# Patient Record
Sex: Female | Born: 1995 | Race: Black or African American | Hispanic: No | Marital: Single | Smoking: Current some day smoker
Health system: Southern US, Community
[De-identification: ages and names within clinical notes are randomized; demographics above are authoritative.]

## PROBLEM LIST (undated history)

## (undated) DIAGNOSIS — K5900 Constipation, unspecified: Secondary | ICD-10-CM

## (undated) DIAGNOSIS — G43909 Migraine, unspecified, not intractable, without status migrainosus: Secondary | ICD-10-CM

## (undated) DIAGNOSIS — A609 Anogenital herpesviral infection, unspecified: Secondary | ICD-10-CM

---
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
## 2015-01-10 ENCOUNTER — Emergency Department (HOSPITAL_BASED_OUTPATIENT_CLINIC_OR_DEPARTMENT_OTHER)
Admission: EM | Admit: 2015-01-10 | Discharge: 2015-01-10 | Disposition: A | Discharge status: Home / Self Care | Payer: Medicaid Other | Attending: Emergency Medicine | Admitting: Emergency Medicine

## 2015-01-10 ENCOUNTER — Encounter (HOSPITAL_BASED_OUTPATIENT_CLINIC_OR_DEPARTMENT_OTHER): Payer: Self-pay | Admitting: Emergency Medicine

## 2015-01-10 DIAGNOSIS — Z79899 Other long term (current) drug therapy: Secondary | ICD-10-CM | POA: Insufficient documentation

## 2015-01-10 DIAGNOSIS — A599 Trichomoniasis, unspecified: Secondary | ICD-10-CM | POA: Diagnosis not present

## 2015-01-10 DIAGNOSIS — N766 Ulceration of vulva: Secondary | ICD-10-CM | POA: Diagnosis not present

## 2015-01-10 DIAGNOSIS — Z3202 Encounter for pregnancy test, result negative: Secondary | ICD-10-CM | POA: Diagnosis not present

## 2015-01-10 DIAGNOSIS — R102 Pelvic and perineal pain: Secondary | ICD-10-CM | POA: Diagnosis present

## 2015-01-10 LAB — URINALYSIS, ROUTINE W REFLEX MICROSCOPIC
Bilirubin Urine: NEGATIVE
Glucose, UA: NEGATIVE mg/dL
HGB URINE DIPSTICK: NEGATIVE
KETONES UR: 15 mg/dL — AB
Leukocytes, UA: NEGATIVE
Nitrite: NEGATIVE
PH: 6.5 (ref 5.0–8.0)
Protein, ur: NEGATIVE mg/dL
Specific Gravity, Urine: 1.03 (ref 1.005–1.030)
UROBILINOGEN UA: 1 mg/dL (ref 0.0–1.0)

## 2015-01-10 LAB — WET PREP, GENITAL: Yeast Wet Prep HPF POC: NONE SEEN

## 2015-01-10 LAB — PREGNANCY, URINE: Preg Test, Ur: NEGATIVE

## 2015-01-10 LAB — GC/CHLAMYDIA PROBE AMP (~~LOC~~) NOT AT ARMC
Chlamydia: NEGATIVE
Neisseria Gonorrhea: NEGATIVE

## 2015-01-10 MED ORDER — AZITHROMYCIN 250 MG PO TABS
1000.0000 mg | ORAL_TABLET | Freq: Once | ORAL | Status: AC
  Administered 2015-01-10: 1000 mg via ORAL
  Filled 2015-01-10: qty 4

## 2015-01-10 MED ORDER — VALACYCLOVIR HCL 1 G PO TABS: 1000.0000 mg | ORAL_TABLET | Freq: Two times a day (BID) | ORAL | Status: AC

## 2015-01-10 MED ORDER — CEFTRIAXONE SODIUM 250 MG IJ SOLR
250.0000 mg | Freq: Once | INTRAMUSCULAR | Status: AC
  Administered 2015-01-10: 250 mg via INTRAMUSCULAR
  Filled 2015-01-10: qty 250

## 2015-01-10 MED ORDER — METRONIDAZOLE 500 MG PO TABS: 500.0000 mg | ORAL_TABLET | Freq: Two times a day (BID) | ORAL | Status: DC

## 2015-01-10 MED ORDER — ONDANSETRON 4 MG PO TBDP
4.0000 mg | ORAL_TABLET | Freq: Once | ORAL | Status: AC
  Administered 2015-01-10: 4 mg via ORAL
  Filled 2015-01-10: qty 1

## 2015-01-10 NOTE — Discharge Instructions (Signed)
Trichomoniasis Trichomoniasis is an infection caused by an organism called Trichomonas. The infection can affect both women and men. In women, the outer female genitalia and the vagina are affected. In men, the penis is mainly affected, but the prostate and other reproductive organs can also be involved. Trichomoniasis is a sexually transmitted infection (STI) and is most often passed to another person through sexual contact.  RISK FACTORS  Having unprotected sexual intercourse.  Having sexual intercourse with an infected partner. SIGNS AND SYMPTOMS  Symptoms of trichomoniasis in women include:  Abnormal gray-green frothy vaginal discharge.  Itching and irritation of the vagina.  Itching and irritation of the area outside the vagina. Symptoms of trichomoniasis in men include:   Penile discharge with or without pain.  Pain during urination. This results from inflammation of the urethra. DIAGNOSIS  Trichomoniasis may be found during a Pap test or physical exam. Your health care provider may use one of the following methods to help diagnose this infection:  Examining vaginal discharge under a microscope. For men, urethral discharge would be examined.  Testing the pH of the vagina with a test tape.  Using a vaginal swab test that checks for the Trichomonas organism. A test is available that provides results within a few minutes.  Doing a culture test for the organism. This is not usually needed. TREATMENT   You may be given medicine to fight the infection. Women should inform their health care provider if they could be or are pregnant. Some medicines used to treat the infection should not be taken during pregnancy.  Your health care provider may recommend over-the-counter medicines or creams to decrease itching or irritation.  Your sexual partner will need to be treated if infected. HOME CARE INSTRUCTIONS   Take medicines only as directed by your health care provider.  Take  over-the-counter medicine for itching or irritation as directed by your health care provider.  Do not have sexual intercourse while you have the infection.  Women should not douche or wear tampons while they have the infection.  Discuss your infection with your partner. Your partner may have gotten the infection from you, or you may have gotten it from your partner.  Have your sex partner get examined and treated if necessary.  Practice safe, informed, and protected sex.  See your health care provider for other STI testing. SEEK MEDICAL CARE IF:   You still have symptoms after you finish your medicine.  You develop abdominal pain.  You have pain when you urinate.  You have bleeding after sexual intercourse.  You develop a rash.  Your medicine makes you sick or makes you throw up (vomit). MAKE SURE YOU:  Understand these instructions.  Will watch your condition.  Will get help right away if you are not doing well or get worse. Document Released: 05/21/2001 Document Revised: 04/11/2014 Document Reviewed: 09/06/2013 [facility] Patient Information 2015 [place], [place]. This information is not intended to replace advice given to you by your health care provider. Make sure you discuss any questions you have with your health care provider. Herpes Simplex Herpes simplex is generally classified as Type 1 or Type 2. Type 1 is generally the type that is responsible for cold sores. Type 2 is generally associated with sexually transmitted diseases. We now know that most of the thoughts on these viruses are inaccurate. We find that HSV1 is also present genitally and HSV2 can be present orally, but this will vary in different locations of the world. Herpes simplex is  usually detected by doing a culture. Blood tests are also available for this virus; however, the accuracy is often not as good.  PREPARATION FOR TEST No preparation or fasting is necessary. NORMAL FINDINGS  No virus present  No  HSV antigens or antibodies present Ranges for normal findings may vary among different laboratories and hospitals. You should always check with your doctor after having lab work or other tests done to discuss the meaning of your test results and whether your values are considered within normal limits. MEANING OF TEST  Your caregiver will go over the test results with you and discuss the importance and meaning of your results, as well as treatment options and the need for additional tests if necessary. OBTAINING THE TEST RESULTS  It is your responsibility to obtain your test results. Ask the lab or department performing the test when and how you will get your results. Document Released: 12/28/2004 Document Revised: 02/17/2012 Document Reviewed: 11/05/2008 [facility] Patient Information 2015 [place], [place]. This information is not intended to replace advice given to you by your health care provider. Make sure you discuss any questions you have with your health care provider.

## 2015-01-10 NOTE — ED Provider Notes (Signed)
CSN: [id number]     Arrival date & time 01/10/15  0415 History   First MD Initiated Contact with Patient 01/10/15 [phone]     Chief Complaint  Patient presents with  . Vaginal Pain     (Consider location/radiation/quality/duration/timing/severity/associated sxs/prior Treatment) Patient is a 19 y.o. female presenting with vaginal pain.  Vaginal Pain This is a new problem. Episode onset: 3 days ago. The problem occurs constantly. The problem has not changed since onset.Pertinent negatives include no chest pain, no abdominal pain, no headaches and no shortness of breath. Exacerbated by: palpation. She has tried nothing for the symptoms.    History reviewed. No pertinent past medical history. History reviewed. No pertinent past surgical history. History reviewed. No pertinent family history. History  Substance Use Topics  . Smoking status: Never Smoker   . Smokeless tobacco: Never Used  . Alcohol Use: No   OB History    No data available     Review of Systems  Respiratory: Negative for shortness of breath.   Cardiovascular: Negative for chest pain.  Gastrointestinal: Negative for abdominal pain.  Genitourinary: Positive for vaginal pain.  Neurological: Negative for headaches.  All other systems reviewed and are negative.     Allergies  Review of patient's allergies indicates no known allergies.  Home Medications   Prior to Admission medications   Medication Sig Start Date End Date Taking? Authorizing Provider  HYDROcodone-acetaminophen (NORCO/VICODIN) 5-325 MG per tablet Take 1-2 tablets by mouth every 6 (six) hours as needed for moderate pain. 10/04/14   Suzi RootsKevin E Steinl, MD  metroNIDAZOLE (FLAGYL) 500 MG tablet Take 1 tablet (500 mg total) by mouth 2 (two) times daily. One po bid x 7 days 01/10/15   Mirian MoMatthew Gentry, MD  ondansetron (ZOFRAN) 4 MG tablet Take 1 tablet (4 mg total) by mouth every 6 (six) hours as needed for nausea or vomiting. 11/06/14   Dione Boozeavid Glick, MD   valACYclovir (VALTREX) 1000 MG tablet Take 1 tablet (1,000 mg total) by mouth 2 (two) times daily. 01/10/15 01/17/15  Mirian MoMatthew Gentry, MD   BP 132/58 mmHg  Pulse 91  Temp(Src) 98.5 F (36.9 C) (Oral)  Resp 16  Wt 120 lb (54.432 kg)  SpO2 100%  LMP 12/17/2014 Physical Exam  Constitutional: She is oriented to person, place, and time. She appears well-developed and well-nourished.  HENT:  Head: Normocephalic and atraumatic.  Right Ear: External ear normal.  Left Ear: External ear normal.  Eyes: Conjunctivae and EOM are normal. Pupils are equal, round, and reactive to light.  Neck: Normal range of motion. Neck supple.  Cardiovascular: Normal rate, regular rhythm, normal heart sounds and intact distal pulses.   Pulmonary/Chest: Effort normal and breath sounds normal.  Abdominal: Soft. Bowel sounds are normal. There is no tenderness.  Genitourinary: Cervix exhibits no motion tenderness and no friability. Right adnexum displays no tenderness. Left adnexum displays no tenderness.  2 ulcerations to exterior surface of labia minora, scant white dc  Musculoskeletal: Normal range of motion.  Neurological: She is alert and oriented to person, place, and time.  Skin: Skin is warm and dry.  Vitals reviewed.   ED Course  Procedures (including critical care time) Labs Review Labs Reviewed  WET PREP, GENITAL - Abnormal; Notable for the following:    Trich, Wet Prep FEW (*)    Clue Cells Wet Prep HPF POC FEW (*)    WBC, Wet Prep HPF POC FEW (*)    All other components within normal limits  URINALYSIS,  ROUTINE W REFLEX MICROSCOPIC - Abnormal; Notable for the following:    Ketones, ur 15 (*)    All other components within normal limits  HERPES SIMPLEX VIRUS CULTURE  PREGNANCY, URINE  RPR  HIV ANTIBODY (ROUTINE TESTING)  GC/CHLAMYDIA PROBE AMP ([place])    Imaging Review No results found.   EKG Interpretation None      MDM   Final diagnoses:  Genital ulcer, female   Trichomoniasis    19 y.o. female without pertinent PMH presents with vaginal ulcerations and pain x 3 days.  One female sexual partner in the last 6 months, unprotected intercourse. No history of sexually transmitted infection. Physical exam as above.  No signs of PID.  Dorna Bloom with trich.  Pt offered and accepted treatment for gc/chlamydia.  HSV swab obtained.  Discussed empiric use of valacyclovir which the patient elected to receive. Discharged home to follow-up with PCP.    I have reviewed all laboratory and imaging studies if ordered as above  1. Genital ulcer, female   2. Trichomoniasis         Mirian Mo, MD 01/10/15 [phone]

## 2015-01-10 NOTE — ED Notes (Signed)
Patient states that she has had pain in her perineal area. X 3 days. Patient states that she has blisters on her vagina

## 2015-01-11 LAB — HERPES SIMPLEX VIRUS CULTURE

## 2015-01-11 LAB — HIV ANTIBODY (ROUTINE TESTING W REFLEX): HIV Screen 4th Generation wRfx: NONREACTIVE

## 2015-01-11 LAB — RPR: RPR: NONREACTIVE

## 2015-01-13 ENCOUNTER — Telehealth (HOSPITAL_BASED_OUTPATIENT_CLINIC_OR_DEPARTMENT_OTHER): Payer: Self-pay | Admitting: *Deleted

## 2015-01-16 ENCOUNTER — Telehealth (HOSPITAL_COMMUNITY): Payer: Self-pay

## 2015-02-07 ENCOUNTER — Emergency Department (HOSPITAL_BASED_OUTPATIENT_CLINIC_OR_DEPARTMENT_OTHER)
Admission: EM | Admit: 2015-02-07 | Discharge: 2015-02-08 | Disposition: A | Discharge status: Home / Self Care | Payer: Medicaid Other | Attending: Emergency Medicine | Admitting: Emergency Medicine

## 2015-02-07 ENCOUNTER — Encounter (HOSPITAL_BASED_OUTPATIENT_CLINIC_OR_DEPARTMENT_OTHER): Payer: Self-pay | Admitting: Emergency Medicine

## 2015-02-07 DIAGNOSIS — R42 Dizziness and giddiness: Secondary | ICD-10-CM | POA: Insufficient documentation

## 2015-02-07 DIAGNOSIS — R109 Unspecified abdominal pain: Secondary | ICD-10-CM | POA: Insufficient documentation

## 2015-02-07 DIAGNOSIS — R112 Nausea with vomiting, unspecified: Secondary | ICD-10-CM | POA: Insufficient documentation

## 2015-02-07 DIAGNOSIS — Z792 Long term (current) use of antibiotics: Secondary | ICD-10-CM | POA: Insufficient documentation

## 2015-02-07 DIAGNOSIS — Z3201 Encounter for pregnancy test, result positive: Secondary | ICD-10-CM | POA: Insufficient documentation

## 2015-02-07 DIAGNOSIS — Z3490 Encounter for supervision of normal pregnancy, unspecified, unspecified trimester: Secondary | ICD-10-CM

## 2015-02-07 DIAGNOSIS — Z3200 Encounter for pregnancy test, result unknown: Secondary | ICD-10-CM | POA: Diagnosis present

## 2015-02-07 LAB — PREGNANCY, URINE: Preg Test, Ur: POSITIVE — AB

## 2015-02-07 MED ORDER — PRENATAL COMPLETE 14-0.4 MG PO TABS: 1.0000 | ORAL_TABLET | Freq: Every day | ORAL | Status: DC

## 2015-02-07 NOTE — ED Notes (Signed)
Pt c/o dizziness when standing, nausea in am, vomited 1or 2 times past couple weeks,  abd pain at times chills/ fever 98 to 100,  Denies vag dc lmp 1/9

## 2015-02-07 NOTE — ED Notes (Signed)
Missed period that was due 2/9.  Nausea x2-3 weeks.  1 home preg said positive and one said neg.  Pt wants to know for sure and what to do next.

## 2015-02-07 NOTE — ED Provider Notes (Signed)
CSN: [id number]     Arrival date & time 02/07/15  2328 History  This chart was scribed for Hanley SeamenJohn L Fredna Stricker, MD by Evon Slackerrance Branch, ED Scribe. This patient was seen in room MH05/MH05 and the patient's care was started at 11:40 PM.     Chief Complaint  Patient presents with  . Possible Pregnancy   Patient is a 19 y.o. female presenting with pregnancy problem. The history is provided by the patient. No language interpreter was used.  Possible Pregnancy   HPI Comments: Leah Knapp is a 19 y.o. female who presents to the Emergency Department for possible pregnancy. Pt states that she thinks that she pregnant. Pt states that she feels dizzy described as "everything spinning" when standing. Pt states that when waking up she feels nauseated. Pt states she has intermittent vomiting and intermittent abdominal pain; she denies pain presently. She also states that she has noticed an increased appetite. Pt state she has been having a fever (max temp 100) and chills as well for the past week. LMP 12/17/2014. Pt states she has taken 2 at-home pregnancy tests; one was positive and the other was negative. Denies vaginal bleeding or vaginal discharge.   History reviewed. No pertinent past medical history. History reviewed. No pertinent past surgical history. No family history on file. History  Substance Use Topics  . Smoking status: Never Smoker   . Smokeless tobacco: Never Used  . Alcohol Use: No   OB History    No data available     Review of Systems  All other systems reviewed and are negative.   Allergies  Review of patient's allergies indicates no known allergies.  Home Medications   Prior to Admission medications   Medication Sig Start Date End Date Taking? Authorizing Provider  HYDROcodone-acetaminophen (NORCO/VICODIN) 5-325 MG per tablet Take 1-2 tablets by mouth every 6 (six) hours as needed for moderate pain. 10/04/14   Suzi RootsKevin E Steinl, MD  metroNIDAZOLE (FLAGYL) 500 MG tablet Take 1  tablet (500 mg total) by mouth 2 (two) times daily. One po bid x 7 days 01/10/15   Mirian MoMatthew Gentry, MD  ondansetron (ZOFRAN) 4 MG tablet Take 1 tablet (4 mg total) by mouth every 6 (six) hours as needed for nausea or vomiting. 11/06/14   Dione Boozeavid Glick, MD   BP 128/73 mmHg  Pulse 120  Temp(Src) 100.4 F (38 C) (Oral)  Resp 18  Ht 5\' 6"  (1.676 m)  Wt 124 lb (56.246 kg)  BMI 20.02 kg/m2  SpO2 100%  LMP 12/17/2014   Physical Exam  Nursing note and vitals reviewed. General: Well-developed, well-nourished female in no acute distress; appearance consistent with age of record HENT: normocephalic; atraumatic Eyes: pupils equal, round and reactive to light; extraocular muscles intact Neck: supple Heart: regular rate and rhythm; no murmurs, rubs or gallops Lungs: clear to auscultation bilaterally Abdomen: soft; nondistended; nontender; no masses or hepatosplenomegaly; bowel sounds present GU: No CVA tenderness.  Extremities: No deformity; full range of motion Neurologic: Awake, alert and oriented; motor function intact in all extremities and symmetric; no facial droop Skin: Warm and dry Psychiatric: Normal mood and affect  ED Course  Procedures (including critical care time) DIAGNOSTIC STUDIES: Oxygen Saturation is 100% on RA, normal by my interpretation.    COORDINATION OF CARE: 11:47 PM-Discussed treatment plan with pt at bedside and pt agreed to plan.     MDM   Nursing notes and vitals signs, including pulse oximetry, reviewed.  Summary of this visit's results, reviewed by  myself:  Labs:  Results for orders placed or performed during the hospital encounter of 02/07/15 (from the past 24 hour(s))  Pregnancy, urine     Status: Abnormal   Collection Time: 02/07/15 11:37 PM  Result Value Ref Range   Preg Test, Ur POSITIVE (A) NEGATIVE   Patient advised of test results. She requests information about termination; Sue Lush, RN will discuss options with her.  I personally performed the  services described in this documentation, which was scribed in my presence. The recorded information has been reviewed and is accurate.     Hanley Seamen, MD 02/07/15 [phone]

## 2015-05-10 ENCOUNTER — Emergency Department (HOSPITAL_BASED_OUTPATIENT_CLINIC_OR_DEPARTMENT_OTHER)
Admission: EM | Admit: 2015-05-10 | Discharge: 2015-05-10 | Disposition: A | Discharge status: Home / Self Care | Payer: Medicaid Other | Attending: Emergency Medicine | Admitting: Emergency Medicine

## 2015-05-10 ENCOUNTER — Emergency Department (HOSPITAL_BASED_OUTPATIENT_CLINIC_OR_DEPARTMENT_OTHER): Payer: Medicaid Other

## 2015-05-10 ENCOUNTER — Encounter (HOSPITAL_BASED_OUTPATIENT_CLINIC_OR_DEPARTMENT_OTHER): Payer: Self-pay | Admitting: Emergency Medicine

## 2015-05-10 DIAGNOSIS — R079 Chest pain, unspecified: Secondary | ICD-10-CM | POA: Diagnosis present

## 2015-05-10 DIAGNOSIS — Z792 Long term (current) use of antibiotics: Secondary | ICD-10-CM | POA: Insufficient documentation

## 2015-05-10 DIAGNOSIS — R0789 Other chest pain: Secondary | ICD-10-CM | POA: Diagnosis not present

## 2015-05-10 DIAGNOSIS — Z79899 Other long term (current) drug therapy: Secondary | ICD-10-CM | POA: Diagnosis not present

## 2015-05-10 DIAGNOSIS — Z3202 Encounter for pregnancy test, result negative: Secondary | ICD-10-CM | POA: Diagnosis not present

## 2015-05-10 LAB — CBC WITH DIFFERENTIAL/PLATELET
BASOS ABS: 0 10*3/uL (ref 0.0–0.1)
Basophils Relative: 0 % (ref 0–1)
Eosinophils Absolute: 0.1 10*3/uL (ref 0.0–0.7)
Eosinophils Relative: 1 % (ref 0–5)
HEMATOCRIT: 37.3 % (ref 36.0–46.0)
HEMOGLOBIN: 12.2 g/dL (ref 12.0–15.0)
Lymphocytes Relative: 26 % (ref 12–46)
Lymphs Abs: 1.8 10*3/uL (ref 0.7–4.0)
MCH: 32.2 pg (ref 26.0–34.0)
MCHC: 32.7 g/dL (ref 30.0–36.0)
MCV: 98.4 fL (ref 78.0–100.0)
MONO ABS: 0.5 10*3/uL (ref 0.1–1.0)
Monocytes Relative: 7 % (ref 3–12)
NEUTROS ABS: 4.6 10*3/uL (ref 1.7–7.7)
Neutrophils Relative %: 66 % (ref 43–77)
Platelets: 208 10*3/uL (ref 150–400)
RBC: 3.79 MIL/uL — ABNORMAL LOW (ref 3.87–5.11)
RDW: 11.7 % (ref 11.5–15.5)
WBC: 7 10*3/uL (ref 4.0–10.5)

## 2015-05-10 LAB — BASIC METABOLIC PANEL
Anion gap: 6 (ref 5–15)
BUN: 10 mg/dL (ref 6–20)
CALCIUM: 9.1 mg/dL (ref 8.9–10.3)
CHLORIDE: 105 mmol/L (ref 101–111)
CO2: 28 mmol/L (ref 22–32)
CREATININE: 0.8 mg/dL (ref 0.44–1.00)
GFR calc Af Amer: >60 mL/min (ref >60)
Glucose, Bld: 97 mg/dL (ref 65–99)
Potassium: 3.3 mmol/L — ABNORMAL LOW (ref 3.5–5.1)
SODIUM: 139 mmol/L (ref 135–145)

## 2015-05-10 LAB — TROPONIN I: Troponin I: <0.03 ng/mL (ref <0.031)

## 2015-05-10 LAB — PREGNANCY, URINE: Preg Test, Ur: NEGATIVE

## 2015-05-10 LAB — D-DIMER, QUANTITATIVE: D-Dimer, Quant: <0.27 ug/mL-FEU (ref 0.00–0.48)

## 2015-05-10 MED ORDER — HYDROCODONE-ACETAMINOPHEN 5-325 MG PO TABS: 1.0000 | ORAL_TABLET | Freq: Four times a day (QID) | ORAL | Status: DC | PRN

## 2015-05-10 NOTE — ED Notes (Signed)
Patient transported to X-ray 

## 2015-05-10 NOTE — Discharge Instructions (Signed)

## 2015-05-10 NOTE — ED Provider Notes (Signed)
CSN: [id number]     Arrival date & time 05/10/15  1601 History   First MD Initiated Contact with Patient 05/10/15 1635     Chief Complaint  Patient presents with  . Chest Pain     HPI Patient presents with chest pain which started around noon today.  Patient states hurts when she takes a deep breath in.  Denies any nausea vomiting or diaphoresis.  No history of coronary disease.  No history of hypertension.  Her heart score = 1 History reviewed. No pertinent past medical history. History reviewed. No pertinent past surgical history. History reviewed. No pertinent family history. History  Substance Use Topics  . Smoking status: Never Smoker   . Smokeless tobacco: Never Used  . Alcohol Use: No   OB History    No data available     Review of Systems All other systems reviewed and are negative   Allergies  Review of patient's allergies indicates no known allergies.  Home Medications   Prior to Admission medications   Medication Sig Start Date End Date Taking? Authorizing Provider  HYDROcodone-acetaminophen (NORCO/VICODIN) 5-325 MG per tablet Take 1-2 tablets by mouth every 6 (six) hours as needed for moderate pain. 05/10/15   Nelva Nayobert Carlen Fils, MD  metroNIDAZOLE (FLAGYL) 500 MG tablet Take 1 tablet (500 mg total) by mouth 2 (two) times daily. One po bid x 7 days 01/10/15   Mirian MoMatthew Gentry, MD  ondansetron (ZOFRAN) 4 MG tablet Take 1 tablet (4 mg total) by mouth every 6 (six) hours as needed for nausea or vomiting. 11/06/14   Dione Boozeavid Glick, MD  Prenatal Vit-Fe Fumarate-FA (PRENATAL COMPLETE) 14-0.4 MG TABS Take 1 tablet by mouth daily. 02/07/15   John Molpus, MD   BP 144/77 mmHg  Pulse 92  Temp(Src) 97.7 F (36.5 C) (Oral)  Resp 18  Ht 5\' 4"  (1.626 m)  Wt 126 lb (57.153 kg)  BMI 21.62 kg/m2  SpO2 100%  LMP 04/18/2015 Physical Exam Physical Exam  Nursing note and vitals reviewed. Constitutional: She is oriented to person, place, and time. She appears well-developed and well-nourished.  No distress.  HENT:  Head: Normocephalic and atraumatic.  Eyes: Pupils are equal, round, and reactive to light.  Neck: Normal range of motion.  Cardiovascular: Normal rate and intact distal pulses.   Pulmonary/Chest: No respiratory distress.  Abdominal: Normal appearance. She exhibits no distension.  Musculoskeletal: Normal range of motion.  Neurological: She is alert and oriented to person, place, and time. No cranial nerve deficit.  Skin: Skin is warm and dry. No rash noted.  Psychiatric: She has a normal mood and affect. Her behavior is normal.   ED Course  Procedures (including critical care time) Labs Review Labs Reviewed  CBC WITH DIFFERENTIAL/PLATELET - Abnormal; Notable for the following:    RBC 3.79 (*)    All other components within normal limits  BASIC METABOLIC PANEL - Abnormal; Notable for the following:    Potassium 3.3 (*)    All other components within normal limits  TROPONIN I  D-DIMER, QUANTITATIVE (NOT AT [facility])  PREGNANCY, URINE    Imaging Review Dg Chest 2 View  05/10/2015   CLINICAL DATA:  Chest pain and shortness of breath today.  EXAM: CHEST  2 VIEW  COMPARISON:  None.  FINDINGS: The heart size and mediastinal contours are within normal limits. Both lungs are clear. The visualized skeletal structures are unremarkable.  IMPRESSION: Normal chest x-ray.   Electronically Signed   By: Orlene PlumP.  Gallerani M.D.  On: 05/10/2015 17:39     EKG Interpretation   Date/Time:  Wednesday May 10 2015 16:14:33 EDT Ventricular Rate:  85 PR Interval:  160 QRS Duration: 66 QT Interval:  370 QTC Calculation: 440 R Axis:   81 Text Interpretation:  Normal sinus rhythm Septal infarct , age  undetermined Abnormal ECG No previous tracing Confirmed by Radford Pax  MD,  Hillari Zumwalt [phone]) on 05/10/2015 4:35:37 PM      MDM   Final diagnoses:  Chest pain        Nelva Nay, MD 05/10/15 1818

## 2015-05-10 NOTE — ED Notes (Signed)
MD at bedside. 

## 2015-05-10 NOTE — ED Notes (Signed)
Pt states chest started hurting around 12 this afternoon.

## 2015-05-28 ENCOUNTER — Emergency Department (HOSPITAL_BASED_OUTPATIENT_CLINIC_OR_DEPARTMENT_OTHER)
Admission: EM | Admit: 2015-05-28 | Discharge: 2015-05-28 | Disposition: A | Discharge status: Home / Self Care | Payer: Medicaid Other | Attending: Emergency Medicine | Admitting: Emergency Medicine

## 2015-05-28 ENCOUNTER — Encounter (HOSPITAL_BASED_OUTPATIENT_CLINIC_OR_DEPARTMENT_OTHER): Payer: Self-pay | Admitting: *Deleted

## 2015-05-28 DIAGNOSIS — Z79899 Other long term (current) drug therapy: Secondary | ICD-10-CM | POA: Diagnosis not present

## 2015-05-28 DIAGNOSIS — N912 Amenorrhea, unspecified: Secondary | ICD-10-CM

## 2015-05-28 DIAGNOSIS — Z3202 Encounter for pregnancy test, result negative: Secondary | ICD-10-CM | POA: Insufficient documentation

## 2015-05-28 DIAGNOSIS — Z720 Tobacco use: Secondary | ICD-10-CM | POA: Diagnosis not present

## 2015-05-28 DIAGNOSIS — Z3200 Encounter for pregnancy test, result unknown: Secondary | ICD-10-CM | POA: Diagnosis present

## 2015-05-28 LAB — PREGNANCY, URINE: PREG TEST UR: NEGATIVE

## 2015-05-28 LAB — HCG, QUANTITATIVE, PREGNANCY: hCG, Beta Chain, Quant, S: <1 m[IU]/mL (ref <5)

## 2015-05-28 NOTE — Discharge Instructions (Signed)
Please repeat pregnancy test in one week if menses has not started.  Recheck with your gynecologist if symptoms persist.

## 2015-05-28 NOTE — ED Notes (Signed)
Patient states her LMP was May 10, she has tried two pregnancy tests and one was positive and the other negative. She came in today to determine if she is pregnant.

## 2015-05-28 NOTE — ED Provider Notes (Signed)
CSN: [id number]     Arrival date & time 05/28/15  0755 History   First MD Initiated Contact with Patient 05/28/15 0813     Chief Complaint  Patient presents with  . Possible Pregnancy     (Consider location/radiation/quality/duration/timing/severity/associated sxs/prior Treatment) HPI  I believe I'm pregnant.  I missed my last period-on June 10th.  LMP May 10.  Took pregnancy test this am and one was positive and one wasn't.  Frequency of urination, breast tenderness.  Denies pain, vaginal bleeding.  G1A1- 10th grade.    History reviewed. No pertinent past medical history. History reviewed. No pertinent past surgical history. History reviewed. No pertinent family history. History  Substance Use Topics  . Smoking status: Current Some Day Smoker  . Smokeless tobacco: Never Used  . Alcohol Use: No   OB History    No data available     Review of Systems  All other systems reviewed and are negative.     Allergies  Review of patient's allergies indicates no known allergies.  Home Medications   Prior to Admission medications   Medication Sig Start Date End Date Taking? Authorizing Provider  HYDROcodone-acetaminophen (NORCO/VICODIN) 5-325 MG per tablet Take 1-2 tablets by mouth every 6 (six) hours as needed for moderate pain. 05/10/15   Nelva Nay, MD  metroNIDAZOLE (FLAGYL) 500 MG tablet Take 1 tablet (500 mg total) by mouth 2 (two) times daily. One po bid x 7 days 01/10/15   Mirian Mo, MD  ondansetron (ZOFRAN) 4 MG tablet Take 1 tablet (4 mg total) by mouth every 6 (six) hours as needed for nausea or vomiting. 11/06/14   Dione Booze, MD  Prenatal Vit-Fe Fumarate-FA (PRENATAL COMPLETE) 14-0.4 MG TABS Take 1 tablet by mouth daily. 02/07/15   John Molpus, MD   BP 122/67 mmHg  Pulse 79  Temp(Src) 98.1 F (36.7 C) (Oral)  Resp 16  Ht 5\' 5"  (1.651 m)  Wt 128 lb (58.06 kg)  BMI 21.30 kg/m2  SpO2 98%  LMP 04/18/2015 Physical Exam  Constitutional: She appears well-developed  and well-nourished.  Nursing note and vitals reviewed.   ED Course  Procedures (including critical care time) Labs Review Labs Reviewed  PREGNANCY, URINE    Imaging Review No results found.   EKG Interpretation None      MDM   Final diagnoses:  Amenorrhea        Margarita Grizzle, MD 05/30/15 1510

## 2015-05-28 NOTE — ED Notes (Signed)
Patient concerned since she has not had a  period since May 10, no other symptoms

## 2015-09-03 ENCOUNTER — Emergency Department (HOSPITAL_BASED_OUTPATIENT_CLINIC_OR_DEPARTMENT_OTHER): Payer: Medicaid Other

## 2015-09-03 ENCOUNTER — Encounter (HOSPITAL_BASED_OUTPATIENT_CLINIC_OR_DEPARTMENT_OTHER): Payer: Self-pay | Admitting: Emergency Medicine

## 2015-09-03 ENCOUNTER — Emergency Department (HOSPITAL_BASED_OUTPATIENT_CLINIC_OR_DEPARTMENT_OTHER)
Admission: EM | Admit: 2015-09-03 | Discharge: 2015-09-03 | Disposition: A | Discharge status: Home / Self Care | Payer: Medicaid Other | Attending: Emergency Medicine | Admitting: Emergency Medicine

## 2015-09-03 DIAGNOSIS — Z3202 Encounter for pregnancy test, result negative: Secondary | ICD-10-CM | POA: Insufficient documentation

## 2015-09-03 DIAGNOSIS — R1030 Lower abdominal pain, unspecified: Secondary | ICD-10-CM | POA: Diagnosis present

## 2015-09-03 DIAGNOSIS — K5900 Constipation, unspecified: Secondary | ICD-10-CM | POA: Diagnosis not present

## 2015-09-03 DIAGNOSIS — Z720 Tobacco use: Secondary | ICD-10-CM | POA: Insufficient documentation

## 2015-09-03 DIAGNOSIS — Z79899 Other long term (current) drug therapy: Secondary | ICD-10-CM | POA: Insufficient documentation

## 2015-09-03 LAB — DIFFERENTIAL
BASOS ABS: 0 10*3/uL (ref 0.0–0.1)
BASOS PCT: 1 %
EOS ABS: 0.2 10*3/uL (ref 0.0–0.7)
Eosinophils Relative: 3 %
Lymphocytes Relative: 45 %
Lymphs Abs: 2.8 10*3/uL (ref 0.7–4.0)
MONO ABS: 0.5 10*3/uL (ref 0.1–1.0)
Monocytes Relative: 8 %
NEUTROS ABS: 2.6 10*3/uL (ref 1.7–7.7)
Neutrophils Relative %: 43 %

## 2015-09-03 LAB — CBC
HCT: 38.4 % (ref 36.0–46.0)
Hemoglobin: 12.9 g/dL (ref 12.0–15.0)
MCH: 32.7 pg (ref 26.0–34.0)
MCHC: 33.6 g/dL (ref 30.0–36.0)
MCV: 97.2 fL (ref 78.0–100.0)
Platelets: 216 10*3/uL (ref 150–400)
RBC: 3.95 MIL/uL (ref 3.87–5.11)
RDW: 11.8 % (ref 11.5–15.5)
WBC: 6.1 10*3/uL (ref 4.0–10.5)

## 2015-09-03 LAB — COMPREHENSIVE METABOLIC PANEL
ALK PHOS: 48 U/L (ref 38–126)
ALT: 11 U/L — ABNORMAL LOW (ref 14–54)
AST: 22 U/L (ref 15–41)
Albumin: 4.6 g/dL (ref 3.5–5.0)
Anion gap: 8 (ref 5–15)
BILIRUBIN TOTAL: 1 mg/dL (ref 0.3–1.2)
BUN: 8 mg/dL (ref 6–20)
CALCIUM: 9.6 mg/dL (ref 8.9–10.3)
CO2: 25 mmol/L (ref 22–32)
CREATININE: 0.89 mg/dL (ref 0.44–1.00)
Chloride: 105 mmol/L (ref 101–111)
GFR calc non Af Amer: >60 mL/min (ref >60)
Glucose, Bld: 86 mg/dL (ref 65–99)
Potassium: 3.5 mmol/L (ref 3.5–5.1)
Sodium: 138 mmol/L (ref 135–145)
Total Protein: 7.9 g/dL (ref 6.5–8.1)

## 2015-09-03 LAB — PREGNANCY, URINE: Preg Test, Ur: NEGATIVE

## 2015-09-03 LAB — URINALYSIS, ROUTINE W REFLEX MICROSCOPIC
Bilirubin Urine: NEGATIVE
GLUCOSE, UA: NEGATIVE mg/dL
Hgb urine dipstick: NEGATIVE
KETONES UR: NEGATIVE mg/dL
LEUKOCYTES UA: NEGATIVE
NITRITE: NEGATIVE
PROTEIN: NEGATIVE mg/dL
Specific Gravity, Urine: 1.016 (ref 1.005–1.030)
Urobilinogen, UA: 1 mg/dL (ref 0.0–1.0)
pH: 6 (ref 5.0–8.0)

## 2015-09-03 LAB — LIPASE, BLOOD: Lipase: 18 U/L — ABNORMAL LOW (ref 22–51)

## 2015-09-03 MED ORDER — DOCUSATE SODIUM 100 MG PO CAPS: 100.0000 mg | ORAL_CAPSULE | Freq: Two times a day (BID) | ORAL | Status: DC | PRN

## 2015-09-03 NOTE — ED Notes (Signed)
Patient is sitting in the waiting room eating fruit loops and drinking coffee. Tolerates it well at this time.

## 2015-09-03 NOTE — ED Notes (Signed)
Patient states that she has pain when she had a BM - last was yesterday. The patient reports that this is on going for the last 2 weeks. She also reports that she has been having frequent Headaches, none right now. The patient is hard to comprehend because she has very vague complaints and is almost unsure why she came. When asked why she is here tonight " Because I was supposed to start my period yesterday and I usually have pretty frequent bowel movements" Patient makes no eye contact in triage

## 2015-09-03 NOTE — ED Notes (Signed)
Updated on wait, no changes, alert, NAD, calm, interactive.

## 2015-09-03 NOTE — ED Provider Notes (Signed)
CSN: [id number]     Arrival date & time 09/03/15  0002 History   First MD Initiated Contact with Patient 09/03/15 [phone]     Chief Complaint  Patient presents with  . Constipation     (Consider location/radiation/quality/duration/timing/severity/associated sxs/prior Treatment) HPI  This is a 19 year old female with a 2-3 week history of constipation. She is vague about what she means by constipation as she usually has a bowel movement every day. She does note that her stools up and harder than usual. This is been associated with abdominal cramping. This primarily in her lower abdomen and when asked how severe she states "it hurts". She has also had associated headaches and missed a menstrual cycle recently. She has had intermittent nausea.   She has taken nothing in the way of laxatives, stool softeners or fiber supplements to treat her symptoms.  History reviewed. No pertinent past medical history. History reviewed. No pertinent past surgical history. History reviewed. No pertinent family history. Social History  Substance Use Topics  . Smoking status: Current Some Day Smoker  . Smokeless tobacco: Never Used  . Alcohol Use: Yes     Comment: occ   OB History    No data available     Review of Systems  All other systems reviewed and are negative.   Allergies  Review of patient's allergies indicates no known allergies.  Home Medications   Prior to Admission medications   Medication Sig Start Date End Date Taking? Authorizing Provider  HYDROcodone-acetaminophen (NORCO/VICODIN) 5-325 MG per tablet Take 1-2 tablets by mouth every 6 (six) hours as needed for moderate pain. 05/10/15   Nelva Nay, MD  metroNIDAZOLE (FLAGYL) 500 MG tablet Take 1 tablet (500 mg total) by mouth 2 (two) times daily. One po bid x 7 days 01/10/15   Mirian Mo, MD  ondansetron (ZOFRAN) 4 MG tablet Take 1 tablet (4 mg total) by mouth every 6 (six) hours as needed for nausea or vomiting. 11/06/14   Dione Booze, MD  Prenatal Vit-Fe Fumarate-FA (PRENATAL COMPLETE) 14-0.4 MG TABS Take 1 tablet by mouth daily. 02/07/15   John Molpus, MD   BP 125/57 mmHg  Pulse 93  Temp(Src) 97.8 F (36.6 C) (Oral)  Resp 18  Ht  (1.651 m)  Wt 130 lb (58.968 kg)  BMI 21.63 kg/m2  SpO2 99%  LMP 08/03/2015   Physical Exam General: Well-developed, well-nourished female in no acute distress; appearance consistent with age of record HENT: normocephalic; atraumatic Eyes: pupils equal, round and reactive to light; extraocular muscles intact Neck: supple Heart: regular rate and rhythm Lungs: clear to auscultation bilaterally Abdomen: soft; nondistended; mild suprapubic tenderness; no masses or hepatosplenomegaly; bowel sounds present Extremities: No deformity; full range of motion; pulses normal Neurologic: Awake, alert and oriented; motor function intact in all extremities and symmetric; no facial droop Skin: Warm and dry Psychiatric: Flat affect    ED Course  Procedures (including critical care time)   MDM   Nursing notes and vitals signs, including pulse oximetry, reviewed.  Summary of this visit's results, reviewed by myself:  Labs:  Results for orders placed or performed during the hospital encounter of 09/03/15 (from the past 24 hour(s))  Lipase, blood     Status: Abnormal   Collection Time: 09/03/15 12:30 AM  Result Value Ref Range   Lipase 18 (L) 22 - 51 U/L  Comprehensive metabolic panel     Status: Abnormal   Collection Time: 09/03/15 12:30 AM  Result Value Ref Range  Sodium 138 135 - 145 mmol/L   Potassium 3.5 3.5 - 5.1 mmol/L   Chloride 105 101 - 111 mmol/L   CO2 25 22 - 32 mmol/L   Glucose, Bld 86 65 - 99 mg/dL   BUN 8 6 - 20 mg/dL   Creatinine, Ser 1.61 0.44 - 1.00 mg/dL   Calcium 9.6 8.9 - 09.6 mg/dL   Total Protein 7.9 6.5 - 8.1 g/dL   Albumin 4.6 3.5 - 5.0 g/dL   AST 22 15 - 41 U/L   ALT 11 (L) 14 - 54 U/L   Alkaline Phosphatase 48 38 - 126 U/L   Total Bilirubin 1.0  0.3 - 1.2 mg/dL   GFR calc non Af Amer >60 >60 mL/min   GFR calc Af Amer >60 >60 mL/min   Anion gap 8 5 - 15  CBC     Status: None   Collection Time: 09/03/15 12:30 AM  Result Value Ref Range   WBC 6.1 4.0 - 10.5 K/uL   RBC 3.95 3.87 - 5.11 MIL/uL   Hemoglobin 12.9 12.0 - 15.0 g/dL   HCT 04.5 40.9 - 81.1 %   MCV 97.2 78.0 - 100.0 fL   MCH 32.7 26.0 - 34.0 pg   MCHC 33.6 30.0 - 36.0 g/dL   RDW 91.4 78.2 - 95.6 %   Platelets 216 150 - 400 K/uL  Urinalysis, Routine w reflex microscopic (not at [facility])     Status: None   Collection Time: 09/03/15 12:30 AM  Result Value Ref Range   Color, Urine YELLOW YELLOW   APPearance CLEAR CLEAR   Specific Gravity, Urine 1.016 1.005 - 1.030   pH 6.0 5.0 - 8.0   Glucose, UA NEGATIVE NEGATIVE mg/dL   Hgb urine dipstick NEGATIVE NEGATIVE   Bilirubin Urine NEGATIVE NEGATIVE   Ketones, ur NEGATIVE NEGATIVE mg/dL   Protein, ur NEGATIVE NEGATIVE mg/dL   Urobilinogen, UA 1.0 0.0 - 1.0 mg/dL   Nitrite NEGATIVE NEGATIVE   Leukocytes, UA NEGATIVE NEGATIVE  Differential     Status: None   Collection Time: 09/03/15 12:30 AM  Result Value Ref Range   Neutrophils Relative % 43 %   Neutro Abs 2.6 1.7 - 7.7 K/uL   Lymphocytes Relative 45 %   Lymphs Abs 2.8 0.7 - 4.0 K/uL   Monocytes Relative 8 %   Monocytes Absolute 0.5 0.1 - 1.0 K/uL   Eosinophils Relative 3 %   Eosinophils Absolute 0.2 0.0 - 0.7 K/uL   Basophils Relative 1 %   Basophils Absolute 0.0 0.0 - 0.1 K/uL  Pregnancy, urine     Status: None   Collection Time: 09/03/15 12:30 AM  Result Value Ref Range   Preg Test, Ur NEGATIVE NEGATIVE    Imaging Studies: Dg Abd 1 View  09/03/2015   CLINICAL DATA:  Pain with bowel movements.  EXAM: ABDOMEN - 1 VIEW  COMPARISON:  None.  FINDINGS: The bowel gas pattern is normal. No radio-opaque calculi or other significant radiographic abnormality are seen.  IMPRESSION: Normal examination.  Mild amount of stool.   Electronically Signed   By: Beckie Salts M.D.    On: 09/03/2015 02:27   4:27 AM Patient advised of reassuring laboratory and x-ray studies. We will try her on a stool softener and refer her to gastroenterology should her symptoms not improved.   Paula Libra, MD 09/03/15 [phone]

## 2015-10-06 ENCOUNTER — Encounter (HOSPITAL_BASED_OUTPATIENT_CLINIC_OR_DEPARTMENT_OTHER): Payer: Self-pay

## 2015-10-06 ENCOUNTER — Emergency Department (HOSPITAL_BASED_OUTPATIENT_CLINIC_OR_DEPARTMENT_OTHER)
Admission: EM | Admit: 2015-10-06 | Discharge: 2015-10-06 | Disposition: A | Discharge status: Home / Self Care | Payer: Medicaid Other | Attending: Emergency Medicine | Admitting: Emergency Medicine

## 2015-10-06 DIAGNOSIS — R51 Headache: Secondary | ICD-10-CM | POA: Diagnosis not present

## 2015-10-06 DIAGNOSIS — R5383 Other fatigue: Secondary | ICD-10-CM | POA: Diagnosis not present

## 2015-10-06 DIAGNOSIS — N6489 Other specified disorders of breast: Secondary | ICD-10-CM | POA: Diagnosis present

## 2015-10-06 DIAGNOSIS — R110 Nausea: Secondary | ICD-10-CM | POA: Diagnosis not present

## 2015-10-06 DIAGNOSIS — R350 Frequency of micturition: Secondary | ICD-10-CM | POA: Diagnosis not present

## 2015-10-06 DIAGNOSIS — Z720 Tobacco use: Secondary | ICD-10-CM | POA: Diagnosis not present

## 2015-10-06 DIAGNOSIS — Z3202 Encounter for pregnancy test, result negative: Secondary | ICD-10-CM | POA: Insufficient documentation

## 2015-10-06 DIAGNOSIS — Z3200 Encounter for pregnancy test, result unknown: Secondary | ICD-10-CM

## 2015-10-06 LAB — PREGNANCY, URINE: PREG TEST UR: NEGATIVE

## 2015-10-06 NOTE — ED Provider Notes (Signed)
CSN: [id number]     Arrival date & time 10/06/15  1714 History   First MD Initiated Contact with Patient 10/06/15 1732     Chief Complaint  Patient presents with  . breast soreness      (Consider location/radiation/quality/duration/timing/severity/associated sxs/prior Treatment) HPI Leah Knapp is a 19 y.o. female with no significant PMH who presents with concern for pregnancy.  Patient states she has been experiencing "pregnancy symptoms" for the past couple of weeks.  She describes the symptoms as headaches, breast soreness, abdominal cramping, increased urinary frequency, fatigue, nausea.  Endorses subjective fevers.  Denies vaginal bleeding/spotting, odor, vaginal discharge, CP, SOB, or vomiting.  At this time, these symptoms have resolved.  She has no complaints today.  Sexually active, no use of birth control, one partner. Patient is G2P0A2.  LMP September 03, 2015.  History reviewed. No pertinent past medical history. History reviewed. No pertinent past surgical history. No family history on file. Social History  Substance Use Topics  . Smoking status: Current Some Day Smoker  . Smokeless tobacco: Never Used  . Alcohol Use: Yes     Comment: occ   OB History    No data available     Review of Systems  All other systems negative unless otherwise stated in HPI   Allergies  Review of patient's allergies indicates no known allergies.  Home Medications   Prior to Admission medications   Not on File   BP 120/60 mmHg  Pulse 78  Temp(Src) 98.9 F (37.2 C) (Oral)  Resp 20  Ht 5\' 5"  (1.651 m)  Wt 128 lb (58.06 kg)  BMI 21.30 kg/m2  SpO2 99%  LMP 09/03/2015 (LMP Unknown) Physical Exam  Constitutional: She is oriented to person, place, and time. She appears well-developed and well-nourished.  HENT:  Head: Normocephalic and atraumatic.  Mouth/Throat: Oropharynx is clear and moist.  Eyes: Conjunctivae are normal.  Neck: Normal range of motion. Neck supple.   Cardiovascular: Normal rate, regular rhythm and normal heart sounds.   No murmur heard. Pulmonary/Chest: Effort normal and breath sounds normal. No accessory muscle usage or stridor. No respiratory distress. She has no wheezes. She has no rhonchi. She has no rales.  Abdominal: Soft. Bowel sounds are normal. She exhibits no distension. There is no tenderness.  Musculoskeletal: Normal range of motion.  Lymphadenopathy:    She has no cervical adenopathy.  Neurological: She is alert and oriented to person, place, and time.  Speech clear without dysarthria.  Skin: Skin is warm and dry.  Psychiatric: She has a normal mood and affect. Her behavior is normal.  Nursing note and vitals reviewed.   ED Course  Procedures (including critical care time) Labs Review Labs Reviewed  PREGNANCY, URINE    Imaging Review No results found. I have personally reviewed and evaluated these images and lab results as part of my medical decision-making.   EKG Interpretation None      MDM   Final diagnoses:  Encounter for pregnancy test    Patient presents with "pregnancy symptoms" and concern for pregnancy.  No vaginal complaints, no breast soreness, no abdominal pain.  VSS, NAD.  On exam, abdomen is soft, nontedner.  Heart RRR, lungs CTAB. No indication for pelvic exam. Will obtain urine pregnancy.  Urine pregnancy negative.  Patient is trying to conceive.  Discussed importance of prenatal vitamins and abstinence from drugs, alcohol, or tobacco.  [facility].  Discussed return precautions.  Patient agrees and acknowledges the above plan.  Cheri Fowler, PA-C 10/06/15 1758  Linwood Dibbles, MD 10/06/15 [phone]

## 2015-10-06 NOTE — ED Notes (Signed)
Entered room to discharge pt, pt was not present and no belongings were present. Pt is assumed to have left prior to receiving discharge papers and instructions but pt was put up for discharge, had no ordered meds, and had no prescriptions to receive. Papers will be left at the Charge desk should pt return for them.

## 2015-10-06 NOTE — Discharge Instructions (Signed)
Prenatal Care °WHAT IS PRENATAL CARE?  °Prenatal care is the process of caring for a pregnant woman before she gives birth. Prenatal care makes sure that she and her baby remain as healthy as possible throughout pregnancy. Prenatal care may be provided by a midwife, family practice health care provider, or a childbirth and pregnancy specialist (obstetrician). Prenatal care may include physical examinations, testing, treatments, and education on nutrition, lifestyle, and social support services. °WHY IS PRENATAL CARE SO IMPORTANT?  °Early and consistent prenatal care increases the chance that you and your baby will remain healthy throughout your pregnancy. This type of care also decreases a baby's risk of being born too early (prematurely), or being born smaller than expected (small for gestational age). Any underlying medical conditions you may have that could pose a risk during your pregnancy are discussed during prenatal care visits. You will also be monitored regularly for any new conditions that may arise during your pregnancy so they can be treated quickly and effectively. °WHAT HAPPENS DURING PRENATAL CARE VISITS? °Prenatal care visits may include the following: °Discussion °Tell your health care provider about any new signs or symptoms you have experienced since your last visit. These might include: °· Nausea or vomiting. °· Increased or decreased level of energy. °· Difficulty sleeping. °· Back or leg pain. °· Weight changes. °· Frequent urination. °· Shortness of breath with physical activity. °· Changes in your skin, such as the development of a rash or itchiness. °· Vaginal discharge or bleeding. °· Feelings of excitement or nervousness. °· Changes in your baby's movements. °You may want to write down any questions or topics you want to discuss with your health care provider and bring them with you to your appointment. °Examination °During your first prenatal care visit, you will likely have a complete  physical exam. Your health care provider will often examine your vagina, cervix, and the position of your uterus, as well as check your heart, lungs, and other body systems. As your pregnancy progresses, your health care provider will measure the size of your uterus and your baby's position inside your uterus. He or she may also examine you for early signs of labor. Your prenatal visits may also include checking your blood pressure and, after about 10-12 weeks of pregnancy, listening to your baby's heartbeat. °Testing °Regular testing often includes: °· Urinalysis. This checks your urine for glucose, protein, or signs of infection. °· Blood count. This checks the levels of white and red blood cells in your body. °· Tests for sexually transmitted infections (STIs). Testing for STIs at the beginning of pregnancy is routinely done and is required in many states. °· Antibody testing. You will be checked to see if you are immune to certain illnesses, such as rubella, that can affect a developing fetus. °· Glucose screen. Around 24-28 weeks of pregnancy, your blood glucose level will be checked for signs of gestational diabetes. Follow-up tests may be recommended. °· Group B strep. This is a bacteria that is commonly found inside a woman's vagina. This test will inform your health care provider if you need an antibiotic to reduce the amount of this bacteria in your body prior to labor and childbirth. °· Ultrasound. Many pregnant women undergo an ultrasound screening around 18-20 weeks of pregnancy to evaluate the health of the fetus and check for any developmental abnormalities. °· HIV (human immunodeficiency virus) testing. Early in your pregnancy, you will be screened for HIV. If you are at high risk for HIV, this test   may be repeated during your third trimester of pregnancy. °You may be offered other testing based on your age, personal or family medical history, or other factors.  °HOW OFTEN SHOULD I PLAN TO SEE MY  HEALTH CARE PROVIDER FOR PRENATAL CARE? °Your prenatal care check-up schedule depends on any medical conditions you have before, or develop during, your pregnancy. If you do not have any underlying medical conditions, you will likely be seen for checkups: °· Monthly, during the first 6 months of pregnancy. °· Twice a month during months 7 and 8 of pregnancy. °· Weekly starting in the 9th month of pregnancy and until delivery. °If you develop signs of early labor or other concerning signs or symptoms, you may need to see your health care provider more often. Ask your health care provider what prenatal care schedule is best for you. °WHAT CAN I DO TO KEEP MYSELF AND MY BABY AS HEALTHY AS POSSIBLE DURING MY PREGNANCY? °· Take a prenatal vitamin containing 400 micrograms (0.4 mg) of folic acid every day. Your health care provider may also ask you to take additional vitamins such as iodine, vitamin D, iron, copper, and zinc. °· Take 1500-2000 mg of calcium daily starting at your 20th week of pregnancy until you deliver your baby. °· Make sure you are up to date on your vaccinations. Unless directed otherwise by your health care provider: °¨ You should receive a tetanus, diphtheria, and pertussis (Tdap) vaccination between the 27th and 36th week of your pregnancy, regardless of when your last Tdap immunization occurred. This helps protect your baby from whooping cough (pertussis) after he or she is born. °¨ You should receive an annual inactivated influenza vaccine (IIV) to help protect you and your baby from influenza. This can be done at any point during your pregnancy. °· Eat a well-rounded diet that includes: °¨ Fresh fruits and vegetables. °¨ Lean proteins. °¨ Calcium-rich foods such as milk, yogurt, hard cheeses, and dark, leafy greens. °¨ Whole grain breads. °· Do not eat seafood high in mercury, including: °¨ Swordfish. °¨ Tilefish. °¨ Shark. °¨ King mackerel. °¨ More than 6 oz tuna per week. °· Do not eat: °¨ Raw  or undercooked meats or eggs. °¨ Unpasteurized foods, such as soft cheeses (brie, blue, or feta), juices, and milks. °¨ Lunch meats. °¨ Hot dogs that have not been heated until they are steaming. °· Drink enough water to keep your urine clear or pale yellow. For many women, this may be 10 or more 8 oz glasses of water each day. Keeping yourself hydrated helps deliver nutrients to your baby and may prevent the start of pre-term uterine contractions. °· Do not use any tobacco products including cigarettes, chewing tobacco, or electronic cigarettes. If you need help quitting, ask your health care provider. °· Do not drink beverages containing alcohol. No safe level of alcohol consumption during pregnancy has been determined. °· Do not use any illegal drugs. These can harm your developing baby or cause a miscarriage. °· Ask your health care provider or pharmacist before taking any prescription or over-the-counter medicines, herbs, or supplements. °· Limit your caffeine intake to no more than 200 mg per day. °· Exercise. Unless told otherwise by your health care provider, try to get 30 minutes of moderate exercise most days of the week. Do not  do high-impact activities, contact sports, or activities with a high risk of falling, such as horseback riding or downhill skiing. °· Get plenty of rest. °· Avoid anything that raises your   body temperature, such as hot tubs and saunas. °· If you own a cat, do not empty its litter box. Bacteria contained in cat feces can cause an infection called toxoplasmosis. This can result in serious harm to the fetus. °· Stay away from chemicals such as insecticides, lead, mercury, and cleaning or paint products that contain solvents. °· Do not have any X-rays taken unless medically necessary. °· Take a childbirth and breastfeeding preparation class. Ask your health care provider if you need a referral or recommendation. °  °This information is not intended to replace advice given to you by  your health care provider. Make sure you discuss any questions you have with your health care provider. °  °Document Released: 11/28/2003 Document Revised: 12/16/2014 Document Reviewed: 02/09/2014 °Elsevier Interactive Patient Education ©2016 [employer] ° °

## 2015-10-06 NOTE — ED Notes (Signed)
Patient here with breast soreness and requesting pregnancy test

## 2016-01-21 ENCOUNTER — Encounter (HOSPITAL_BASED_OUTPATIENT_CLINIC_OR_DEPARTMENT_OTHER): Payer: Self-pay | Admitting: Emergency Medicine

## 2016-01-21 ENCOUNTER — Emergency Department (HOSPITAL_BASED_OUTPATIENT_CLINIC_OR_DEPARTMENT_OTHER)
Admission: EM | Admit: 2016-01-21 | Discharge: 2016-01-21 | Disposition: A | Discharge status: Home / Self Care | Payer: Medicaid Other | Attending: Emergency Medicine | Admitting: Emergency Medicine

## 2016-01-21 DIAGNOSIS — R112 Nausea with vomiting, unspecified: Secondary | ICD-10-CM | POA: Diagnosis not present

## 2016-01-21 DIAGNOSIS — R6883 Chills (without fever): Secondary | ICD-10-CM | POA: Insufficient documentation

## 2016-01-21 DIAGNOSIS — F17200 Nicotine dependence, unspecified, uncomplicated: Secondary | ICD-10-CM | POA: Diagnosis not present

## 2016-01-21 DIAGNOSIS — Z3202 Encounter for pregnancy test, result negative: Secondary | ICD-10-CM | POA: Diagnosis not present

## 2016-01-21 LAB — COMPREHENSIVE METABOLIC PANEL
ALT: 15 U/L (ref 14–54)
AST: 32 U/L (ref 15–41)
Albumin: 4.8 g/dL (ref 3.5–5.0)
Alkaline Phosphatase: 47 U/L (ref 38–126)
Anion gap: 13 (ref 5–15)
BUN: 9 mg/dL (ref 6–20)
CO2: 24 mmol/L (ref 22–32)
Calcium: 9.7 mg/dL (ref 8.9–10.3)
Chloride: 105 mmol/L (ref 101–111)
Creatinine, Ser: 0.76 mg/dL (ref 0.44–1.00)
GFR calc Af Amer: >60 mL/min (ref >60)
GFR calc non Af Amer: >60 mL/min (ref >60)
Glucose, Bld: 114 mg/dL — ABNORMAL HIGH (ref 65–99)
POTASSIUM: 3.6 mmol/L (ref 3.5–5.1)
Sodium: 142 mmol/L (ref 135–145)
TOTAL PROTEIN: 8.6 g/dL — AB (ref 6.5–8.1)
Total Bilirubin: 1 mg/dL (ref 0.3–1.2)

## 2016-01-21 LAB — LIPASE, BLOOD: Lipase: 21 U/L (ref 11–51)

## 2016-01-21 LAB — CBC
HEMATOCRIT: 40 % (ref 36.0–46.0)
Hemoglobin: 13.2 g/dL (ref 12.0–15.0)
MCH: 31.7 pg (ref 26.0–34.0)
MCHC: 33 g/dL (ref 30.0–36.0)
MCV: 95.9 fL (ref 78.0–100.0)
PLATELETS: 249 10*3/uL (ref 150–400)
RBC: 4.17 MIL/uL (ref 3.87–5.11)
RDW: 11.9 % (ref 11.5–15.5)
WBC: 9.9 10*3/uL (ref 4.0–10.5)

## 2016-01-21 LAB — URINE MICROSCOPIC-ADD ON

## 2016-01-21 LAB — URINALYSIS, ROUTINE W REFLEX MICROSCOPIC
Bilirubin Urine: NEGATIVE
GLUCOSE, UA: NEGATIVE mg/dL
Hgb urine dipstick: NEGATIVE
KETONES UR: 15 mg/dL — AB
NITRITE: NEGATIVE
PH: 8 (ref 5.0–8.0)
Protein, ur: 100 mg/dL — AB
SPECIFIC GRAVITY, URINE: 1.027 (ref 1.005–1.030)

## 2016-01-21 LAB — PREGNANCY, URINE: Preg Test, Ur: NEGATIVE

## 2016-01-21 MED ORDER — ONDANSETRON HCL 4 MG/2ML IJ SOLN
4.0000 mg | Freq: Once | INTRAMUSCULAR | Status: AC | PRN
  Administered 2016-01-21: 4 mg via INTRAVENOUS
  Filled 2016-01-21: qty 2

## 2016-01-21 NOTE — Discharge Instructions (Signed)

## 2016-01-21 NOTE — ED Notes (Signed)
Patient states that she has been throwing up since about 0400 this am. Denies any abdominal pain of loose stools

## 2016-01-21 NOTE — ED Notes (Signed)
Patient continues to report that she is unable to provide a urine at this time

## 2016-01-21 NOTE — ED Notes (Signed)
Presents with weakness and N/V, states has vomited multiple times, unable to eat, states onset was at 0400hrs this am. Denies any abd pain

## 2016-01-21 NOTE — ED Provider Notes (Signed)
CSN: [id number]     Arrival date & time 01/21/16  1527 History   First MD Initiated Contact with Patient 01/21/16 1612     Chief Complaint  Patient presents with  . Vomiting     (Consider location/radiation/quality/duration/timing/severity/associated sxs/prior Treatment) Patient is a 20 y.o. female presenting with vomiting. The history is provided by the patient.  Emesis Severity:  Moderate Duration:  1 day Timing:  Constant Number of daily episodes:  20x Quality:  Stomach contents Progression:  Unchanged Chronicity:  New Recent urination:  Normal Relieved by:  Nothing Worsened by:  Nothing tried Ineffective treatments:  None tried Associated symptoms: chills   Associated symptoms: no abdominal pain and no fever   Risk factors: alcohol use (occasional)   Risk factors: no diabetes and not pregnant now     History reviewed. No pertinent past medical history. History reviewed. No pertinent past surgical history. History reviewed. No pertinent family history. Social History  Substance Use Topics  . Smoking status: Current Some Day Smoker  . Smokeless tobacco: Never Used  . Alcohol Use: Yes     Comment: occ   OB History    No data available     Review of Systems  Constitutional: Positive for chills.  Gastrointestinal: Positive for vomiting. Negative for abdominal pain.  All other systems reviewed and are negative.     Allergies  Review of patient's allergies indicates no known allergies.  Home Medications   Prior to Admission medications   Not on File   BP 156/82 mmHg  Pulse 55  Temp(Src) 97.6 F (36.4 C) (Oral)  Resp 18  Ht  (1.676 m)  Wt 130 lb (58.968 kg)  BMI 20.99 kg/m2  SpO2 100%  LMP 01/11/2016 Physical Exam  Constitutional: She is oriented to person, place, and time. She appears well-developed and well-nourished. No distress.  HENT:  Head: Normocephalic.  Eyes: Conjunctivae are normal.  Neck: Neck supple. No tracheal deviation present.   Cardiovascular: Normal rate, regular rhythm and normal heart sounds.   Pulmonary/Chest: Effort normal and breath sounds normal. No respiratory distress.  Abdominal: Soft. She exhibits no distension. There is no tenderness. There is no rebound and no guarding.  Neurological: She is alert and oriented to person, place, and time.  Skin: Skin is warm and dry.  Psychiatric: She has a normal mood and affect.  Vitals reviewed.   ED Course  Procedures (including critical care time) Labs Review Labs Reviewed  COMPREHENSIVE METABOLIC PANEL - Abnormal; Notable for the following:    Glucose, Bld 114 (*)    Total Protein 8.6 (*)    All other components within normal limits  URINALYSIS, ROUTINE W REFLEX MICROSCOPIC (NOT AT [facility]) - Abnormal; Notable for the following:    Ketones, ur 15 (*)    Protein, ur 100 (*)    Leukocytes, UA TRACE (*)    All other components within normal limits  URINE MICROSCOPIC-ADD ON - Abnormal; Notable for the following:    Squamous Epithelial / LPF 0-5 (*)    Bacteria, UA MANY (*)    All other components within normal limits  LIPASE, BLOOD  CBC  PREGNANCY, URINE    Imaging Review No results found. I have personally reviewed and evaluated these images and lab results as part of my medical decision-making.   EKG Interpretation None      MDM   Final diagnoses:  Non-intractable vomiting with nausea, vomiting of unspecified type   Patient presents with emesis starting today.  Multiple episodes, difficulty holding down food and fluids by mouth, mild epigastric tenderness and pain following onset of vomiting. No fever. MMM, not lethargic appearing, no nuchal rigidity, confusion or obtundation to suggest meningitis. No diarrhea, no suspect food intake, no change in diet. Most likely viral gastritis or other self-limited process by history and clinical appearance. Zofran and po challenge with good relief of symptoms, plan for PCP follow up as needed and supportive  care until likely spontaneous resolution. Return precautions discussed for inability to tolerate po fluids or concerning changes in severity or quality of abdominal pain.      Lyndal Pulley, MD 01/21/16 [phone]

## 2016-10-04 ENCOUNTER — Encounter (HOSPITAL_BASED_OUTPATIENT_CLINIC_OR_DEPARTMENT_OTHER): Payer: Self-pay | Admitting: *Deleted

## 2016-10-04 ENCOUNTER — Emergency Department (HOSPITAL_BASED_OUTPATIENT_CLINIC_OR_DEPARTMENT_OTHER): Payer: Medicaid Other

## 2016-10-04 ENCOUNTER — Emergency Department (HOSPITAL_BASED_OUTPATIENT_CLINIC_OR_DEPARTMENT_OTHER)
Admission: EM | Admit: 2016-10-04 | Discharge: 2016-10-04 | Disposition: A | Discharge status: Home / Self Care | Payer: Medicaid Other | Attending: Emergency Medicine | Admitting: Emergency Medicine

## 2016-10-04 DIAGNOSIS — R1030 Lower abdominal pain, unspecified: Secondary | ICD-10-CM

## 2016-10-04 DIAGNOSIS — Z79899 Other long term (current) drug therapy: Secondary | ICD-10-CM | POA: Diagnosis not present

## 2016-10-04 DIAGNOSIS — O99331 Smoking (tobacco) complicating pregnancy, first trimester: Secondary | ICD-10-CM | POA: Insufficient documentation

## 2016-10-04 DIAGNOSIS — F17200 Nicotine dependence, unspecified, uncomplicated: Secondary | ICD-10-CM | POA: Diagnosis not present

## 2016-10-04 DIAGNOSIS — N898 Other specified noninflammatory disorders of vagina: Secondary | ICD-10-CM | POA: Diagnosis present

## 2016-10-04 DIAGNOSIS — O219 Vomiting of pregnancy, unspecified: Secondary | ICD-10-CM | POA: Diagnosis not present

## 2016-10-04 DIAGNOSIS — R102 Pelvic and perineal pain: Secondary | ICD-10-CM

## 2016-10-04 DIAGNOSIS — B9689 Other specified bacterial agents as the cause of diseases classified elsewhere: Secondary | ICD-10-CM

## 2016-10-04 DIAGNOSIS — Z3A11 11 weeks gestation of pregnancy: Secondary | ICD-10-CM | POA: Insufficient documentation

## 2016-10-04 DIAGNOSIS — O23591 Infection of other part of genital tract in pregnancy, first trimester: Secondary | ICD-10-CM | POA: Diagnosis not present

## 2016-10-04 DIAGNOSIS — N760 Acute vaginitis: Secondary | ICD-10-CM

## 2016-10-04 DIAGNOSIS — N920 Excessive and frequent menstruation with regular cycle: Secondary | ICD-10-CM

## 2016-10-04 DIAGNOSIS — O209 Hemorrhage in early pregnancy, unspecified: Secondary | ICD-10-CM | POA: Diagnosis not present

## 2016-10-04 LAB — COMPREHENSIVE METABOLIC PANEL
ALK PHOS: 43 U/L (ref 38–126)
ALT: 19 U/L (ref 14–54)
AST: 26 U/L (ref 15–41)
Albumin: 4.3 g/dL (ref 3.5–5.0)
Anion gap: 7 (ref 5–15)
BUN: 11 mg/dL (ref 6–20)
CALCIUM: 9.4 mg/dL (ref 8.9–10.3)
CO2: 23 mmol/L (ref 22–32)
CREATININE: 0.57 mg/dL (ref 0.44–1.00)
Chloride: 104 mmol/L (ref 101–111)
Glucose, Bld: 87 mg/dL (ref 65–99)
Potassium: 3.8 mmol/L (ref 3.5–5.1)
SODIUM: 134 mmol/L — AB (ref 135–145)
Total Bilirubin: 0.6 mg/dL (ref 0.3–1.2)
Total Protein: 7.7 g/dL (ref 6.5–8.1)

## 2016-10-04 LAB — WET PREP, GENITAL
SPERM: NONE SEEN
Trich, Wet Prep: NONE SEEN
YEAST WET PREP: NONE SEEN

## 2016-10-04 LAB — URINALYSIS, ROUTINE W REFLEX MICROSCOPIC
Bilirubin Urine: NEGATIVE
Glucose, UA: NEGATIVE mg/dL
Hgb urine dipstick: NEGATIVE
Ketones, ur: NEGATIVE mg/dL
Nitrite: NEGATIVE
Protein, ur: NEGATIVE mg/dL
Specific Gravity, Urine: 1.019 (ref 1.005–1.030)
pH: 6 (ref 5.0–8.0)

## 2016-10-04 LAB — URINE MICROSCOPIC-ADD ON

## 2016-10-04 LAB — CBC WITH DIFFERENTIAL/PLATELET
Basophils Absolute: 0 K/uL (ref 0.0–0.1)
Basophils Relative: 0 %
Eosinophils Absolute: 0 K/uL (ref 0.0–0.7)
Eosinophils Relative: 1 %
HCT: 35.7 % — ABNORMAL LOW (ref 36.0–46.0)
Hemoglobin: 12.2 g/dL (ref 12.0–15.0)
Lymphocytes Relative: 9 %
Lymphs Abs: 0.7 K/uL (ref 0.7–4.0)
MCH: 32.7 pg (ref 26.0–34.0)
MCHC: 34.2 g/dL (ref 30.0–36.0)
MCV: 95.7 fL (ref 78.0–100.0)
Monocytes Absolute: 0.5 K/uL (ref 0.1–1.0)
Monocytes Relative: 7 %
Neutro Abs: 6.5 K/uL (ref 1.7–7.7)
Neutrophils Relative %: 83 %
Platelets: 196 K/uL (ref 150–400)
RBC: 3.73 MIL/uL — ABNORMAL LOW (ref 3.87–5.11)
RDW: 12.1 % (ref 11.5–15.5)
WBC: 7.8 K/uL (ref 4.0–10.5)

## 2016-10-04 LAB — ABO/RH: ABO/RH(D): O POS

## 2016-10-04 LAB — LIPASE, BLOOD: Lipase: 19 U/L (ref 11–51)

## 2016-10-04 LAB — HCG, QUANTITATIVE, PREGNANCY: HCG, BETA CHAIN, QUANT, S: 157638 m[IU]/mL — AB (ref <5)

## 2016-10-04 MED ORDER — ACETAMINOPHEN 325 MG PO TABS
975.0000 mg | ORAL_TABLET | Freq: Once | ORAL | Status: AC
  Administered 2016-10-04: 975 mg via ORAL
  Filled 2016-10-04: qty 3

## 2016-10-04 MED ORDER — SODIUM CHLORIDE 0.9 % IV BOLUS (SEPSIS)
1000.0000 mL | Freq: Once | INTRAVENOUS | Status: AC
  Administered 2016-10-04: 1000 mL via INTRAVENOUS

## 2016-10-04 MED ORDER — PROMETHAZINE HCL 25 MG/ML IJ SOLN
INTRAMUSCULAR | Status: AC
  Filled 2016-10-04: qty 1

## 2016-10-04 MED ORDER — DOXYLAMINE-PYRIDOXINE 10-10 MG PO TBEC: DELAYED_RELEASE_TABLET | ORAL | 1 refills | Status: DC

## 2016-10-04 MED ORDER — PROMETHAZINE HCL 25 MG/ML IJ SOLN
12.5000 mg | Freq: Once | INTRAMUSCULAR | Status: AC
  Administered 2016-10-04: 12.5 mg via INTRAVENOUS

## 2016-10-04 NOTE — ED Notes (Signed)
C/o n/v. Pt request applesauce. PA aware. Gingerale given.

## 2016-10-04 NOTE — ED Provider Notes (Signed)
[facility]   CSN: [id number] Arrival date & time: 10/04/16  [id number]     History   Chief Complaint Chief Complaint  Patient presents with  . Emesis    HPI   Blood pressure 124/78, pulse 90, temperature 98.4 F (36.9 C), temperature source Oral, resp. rate 16, height 5\' 5"  (1.651 m), weight 56.7 kg, last menstrual period 07/19/2016, SpO2 100 %.  Leah Knapp is a 20 y.o. female (G2 P0) 11 weeks by LMP has not been evaluated by OB/GYN) complaining of multiple episodes of nonbloody, nonbilious, non-coffee-ground emesis onset this a.m. with associated 7 out of 10 midline lower abdominal pain with associated mild vaginal spotting onset 2 days ago. No passage of any tissue. Patient denies fever, chills, change in bowel or bladder habits including dysuria, hematuria, abnormal vaginal discharge, syncope, lightheadedness. No pain medications taken prior to arrival, no prior surgeries. Patient has her appendix and her gallbladder. She's been taking prenatal vitamins over-the-counter. [facility] appointment [facility]. LMP 07/19/2016.  History reviewed. No pertinent past medical history.  There are no active problems to display for this patient.   History reviewed. No pertinent surgical history.  OB History    Gravida Para Term Preterm AB Living   1             SAB TAB Ectopic Multiple Live Births                   Home Medications    Prior to Admission medications   Medication Sig Start Date End Date Taking? Authorizing Provider  Prenatal Vit-Fe Fumarate-FA (MULTIVITAMIN-PRENATAL) 27-0.8 MG TABS tablet Take 1 tablet by mouth daily at 12 noon.   Yes Historical Provider, MD  Doxylamine-Pyridoxine 10-10 MG TBEC 2-4 tabs PO divided qd-tid  Start: 2 tabs PO qhs; if sx persist after 2 days, incr. to 1 tab PO qam and 2 tabs PO qhs; may further incr. to 1 tab PO qam, 1 tab PO every mid-afternoon, and 2 tabs PO qhs;  Max: 4 tabs/day; Info: give on empty stomach; do not cut/crush/chew tab 10/04/16   Wynetta EmeryNicole Kadian Barcellos, PA-C    Family History No family history on file.  Social History Social History  Substance Use Topics  . Smoking status: Current Some Day Smoker  . Smokeless tobacco: Never Used  . Alcohol use Yes     Comment: occ     Allergies   Review of patient's allergies indicates no known allergies.   Review of Systems Review of Systems  10 systems reviewed and found to be negative, except as noted in the HPI.   Physical Exam Updated Vital Signs BP 119/71   Pulse 95   Temp 98.4 F (36.9 C) (Oral)   Resp 16   Ht 5\' 5"  (1.651 m)   Wt 56.7 kg   LMP 07/19/2016   SpO2 100%   BMI 20.80 kg/m   Physical Exam  Constitutional: She is oriented to person, place, and time. She appears well-developed and well-nourished. No distress.  HENT:  Head: Normocephalic and atraumatic.  Mouth/Throat: Oropharynx is clear and moist.  Eyes: Conjunctivae and EOM are normal. Pupils are equal, round, and reactive to light.  Neck: Normal range of motion.  Cardiovascular: Normal rate, regular rhythm and intact distal pulses.   Pulmonary/Chest: Effort normal and breath sounds normal. No respiratory distress. She has no wheezes. She has no rales. She exhibits no tenderness.  Abdominal: Soft.  She exhibits no distension and no mass. There is tenderness. There is no rebound and no guarding. No hernia.  Mild suprapubic tenderness with no guarding or rebound.   Tenderness to palpation over McBurney's point, Rovsing, psoas and obturator are negative  Genitourinary:  Genitourinary Comments: No CVA tenderness to percussion bilaterally.  Pelvic exam chaperoned by PA student Rose: No rashes or lesions, no abnormal vaginal discharge, no blood in the posterior fornix. No cervical motion or adnexal tenderness.  Musculoskeletal: Normal range of motion.  Neurological: She is alert and oriented to person, place, and  time.  Skin: She is not diaphoretic.  Psychiatric:  Tearful, appears upset  Nursing note and vitals reviewed.    ED Treatments / Results  Labs (all labs ordered are listed, but only abnormal results are displayed) Labs Reviewed  WET PREP, GENITAL - Abnormal; Notable for the following:       Result Value   Clue Cells Wet Prep HPF POC PRESENT (*)    WBC, Wet Prep HPF POC MANY (*)    All other components within normal limits  CBC WITH DIFFERENTIAL/PLATELET - Abnormal; Notable for the following:    RBC 3.73 (*)    HCT 35.7 (*)    All other components within normal limits  COMPREHENSIVE METABOLIC PANEL - Abnormal; Notable for the following:    Sodium 134 (*)    All other components within normal limits  URINALYSIS, ROUTINE W REFLEX MICROSCOPIC (NOT AT [facility]) - Abnormal; Notable for the following:    Leukocytes, UA TRACE (*)    All other components within normal limits  HCG, QUANTITATIVE, PREGNANCY - Abnormal; Notable for the following:    hCG, Beta Chain, [place] Longest 161,096 (*)    All other components within normal limits  URINE MICROSCOPIC-ADD ON - Abnormal; Notable for the following:    Squamous Epithelial / LPF 6-30 (*)    Bacteria, UA RARE (*)    All other components within normal limits  LIPASE, BLOOD  RPR  HIV ANTIBODY (ROUTINE TESTING)  ABO/RH  GC/CHLAMYDIA PROBE AMP ([place]) NOT AT [facility]    EKG  EKG Interpretation None       Radiology US Ob Comp Less 14 Wks  Result Date: 10/04/2016 CLINICAL DATA:  Low abdominal pain. Light vaginal spotting beginning 1 day ago. Patient is [redacted] weeks pregnant based on her last menstrual period. EXAM: OBSTETRIC <14 WK [place] TECHNIQUE: Transabdominal ultrasound examination was performed for evaluation of the gestation as well as the maternal uterus, adnexal regions, and pelvic cul-de-sac. COMPARISON:  None. FINDINGS: Intrauterine gestational sac: Single Yolk sac:  Visualized. Embryo:  Visualized. Cardiac Activity: Visualized. Heart  Rate: 171  bpm CRL:  34  mm   10 w   2 d                  [place] EDC: 04/30/2017 Subchorionic hemorrhage:  Small subchronic hemorrhage. Maternal uterus/adnexae: No uterine masses. Cervix is closed. Normal ovaries. No adnexal masses. No pelvic free fluid. IMPRESSION: 1. Single live intrauterine pregnancy with a measured gestational age of [redacted] weeks and 2 days. 2. Small subchronic hemorrhage.  No other pregnancy complication. Electronically Signed   By: Amie Portland M.D.   On: 10/04/2016 11:10    Procedures Procedures (including critical care time)  Medications Ordered in ED Medications  promethazine (PHENERGAN) 25 MG/ML injection (not administered)  sodium chloride 0.9 % bolus 1,000 mL (0 mLs Intravenous Stopped 10/04/16 1129)  acetaminophen (TYLENOL) tablet 975 mg (975 mg  Oral Given 10/04/16 1107)  promethazine (PHENERGAN) injection 12.5 mg (12.5 mg Intravenous Given 10/04/16 1348)     Initial Impression / Assessment and Plan / ED Course  I have reviewed the triage vital signs and the nursing notes.  Pertinent labs & imaging results that were available during my care of the patient were reviewed by me and considered in my medical decision making (see chart for details).  Clinical Course    Vitals:   10/04/16 0922 10/04/16 0923 10/04/16 1131 10/04/16 1241  BP: 124/78  123/59 119/71  Pulse: 90  88 95  Resp: 16  16   Temp: 98.4 F (36.9 C)     TempSrc: Oral     SpO2: 100%  100% 100%  Weight:  56.7 kg    Height:  5\' 5"  (1.651 m)      Medications  promethazine (PHENERGAN) 25 MG/ML injection (not administered)  sodium chloride 0.9 % bolus 1,000 mL (0 mLs Intravenous Stopped 10/04/16 1129)  acetaminophen (TYLENOL) tablet 975 mg (975 mg Oral Given 10/04/16 1107)  promethazine (PHENERGAN) injection 12.5 mg (12.5 mg Intravenous Given 10/04/16 1348)    Leah Knapp is 20 y.o. female presenting withAltered level episodes of emesis onset this a.m., patient is [redacted] weeks pregnant by LMP.  Has not been evaluated by OB/GYN or had any imaging. She had some moderate suprapubic abdominal pain starting this morning with the vomiting. She had some spotting yesterday. Abdominal exam is nonsurgical, no hypotension or tachycardia. Consideration for ectopic. Will obtain ultrasound, will obtain Rh workup in addition to general blood work.k reassuring. Urinalysis is contaminated but without signs of gross infection. Pelvic exam without any cervical motion or adnexal tenderness. She is Rh+. Ultrasound with IUP dated 10 weeks 2 days with a small subchorionic hemorrhage. Will PO challenge. Patient tolerating by mouth's, repeat abdominal exam remains benign, I doubt this is an appendicitis or cholecystitis.   Discussed case with attending physician who agrees with care plan and disposition.   Evaluation does not show pathology that would require ongoing emergent intervention or inpatient treatment. Pt is hemodynamically stable and mentating appropriately. Discussed findings and plan with patient/guardian, who agrees with care plan. All questions answered. Return precautions discussed and outpatient follow up given.    Final Clinical Impressions(s) / ED Diagnoses   Final diagnoses:  First trimester bleeding  Lower abdominal pain  Vomiting during pregnancy  Bacterial vaginosis    New Prescriptions Discharge Medication List as of 10/04/2016  2:12 PM    START taking these medications   Details  Doxylamine-Pyridoxine 10-10 MG TBEC 2-4 tabs PO divided qd-tid  Start: 2 tabs PO qhs; if sx persist after 2 days, incr. to 1 tab PO qam and 2 tabs PO qhs; may further incr. to 1 tab PO qam, 1 tab PO every mid-afternoon, and 2 tabs PO qhs; Max: 4 tabs/day; Info: give on empty stomach; do  not cut/crush/chew tab, Print         Wynetta Emery, PA-C 10/04/16 1511    Geoffery Lyons, MD 10/05/16 [phone]

## 2016-10-04 NOTE — ED Triage Notes (Signed)
C/o vomiting that started this am. States she is [redacted] weeks pregnant. No fever or diarrhea.

## 2016-10-04 NOTE — Discharge Instructions (Addendum)
Buy Doxylamine and vitamin B6  Doxylamine - Most commonly available as Unisom. READ THE LABEL. You must get doxylamine and nothing else. Typically available in 25mg  tablets. Vitamin B6 - Try to find a small dose. The smallest I could find were 50mg  tablets.  First thing in the morning  Dose 1: 12.5mg  Doxylamine & 12.5mg  B6  Lunchtime  Dose 2: 12.5mg  Doxylamine & 12.5mg  B6  Before dinner  Dose 3: 12.5mg  Doxylamine & 12.5mg  B6  Bedtime  Dose 4: 25mg  Doxylamine & 25mg  B6  You will need to take these at regularly spaced intervals (I've had good luck with every six hours) and continuously to see results. I felt better in about three days (drastically better). You can take up to 75mg  Doxylamine and 200mg  B6 daily. So if you need more, go ahead and adjust as necessary.   Follow with OB/GYN as soon as possible.   Please perform pelvic rest do not have intercourse or insert anything into the vagina.  Do NOT take any NSAIDs, such as Aspirin, Motrin, Ibuprofen, Aleve, Naproxen etc. Only take Tylenol for pain. Return to the emergency room  for any severe abdominal pain, increasing vaginal bleeding, passing out or repeated vomiting.   Obtain over-the-counter prenatal vitamins. Read the label and make sure that they have at least 400 mcg of folate acid.    You may experience vaginal bleeding up to one pad an hour if the bleeding is more than this, please [facility] to [facility] for [facility]. If you develop a fever or any other concerning symptoms please present for a [facility].

## 2016-10-05 LAB — RPR: RPR: NONREACTIVE

## 2016-10-05 LAB — HIV ANTIBODY (ROUTINE TESTING W REFLEX): HIV SCREEN 4TH GENERATION: NONREACTIVE

## 2016-10-05 NOTE — ED Notes (Signed)
Patient called and stated she did not receive her prescription for bacterial vaginosis. Spoke with PA and called in prescription to [facility] in [place] for:   metrogel vaginal 0.75% One applicator full BID 70G 0 refill  Per lisa sanders  Informed patient

## 2016-10-07 LAB — GC/CHLAMYDIA PROBE AMP (~~LOC~~) NOT AT ARMC
CHLAMYDIA, DNA PROBE: POSITIVE — AB
Neisseria Gonorrhea: NEGATIVE

## 2016-10-08 ENCOUNTER — Telehealth (HOSPITAL_BASED_OUTPATIENT_CLINIC_OR_DEPARTMENT_OTHER): Payer: Self-pay | Admitting: Emergency Medicine

## 2016-10-08 NOTE — Telephone Encounter (Signed)
Chart handoff to EDP for treatment plan for + chlamydia, pregnant

## 2016-10-09 ENCOUNTER — Telehealth (HOSPITAL_BASED_OUTPATIENT_CLINIC_OR_DEPARTMENT_OTHER): Payer: Self-pay | Admitting: *Deleted

## 2016-10-09 NOTE — Telephone Encounter (Signed)
Spoke with patient, verified ID, informed of labs, Azithromycin 1 gram PO x 1 dose [facility] [address] [address] [place] [phone], patient informed to abstain for sexual activity x 10 days and notify sexual partners for evaluation and treatment

## 2017-01-01 ENCOUNTER — Emergency Department (HOSPITAL_BASED_OUTPATIENT_CLINIC_OR_DEPARTMENT_OTHER)
Admission: EM | Admit: 2017-01-01 | Discharge: 2017-01-01 | Disposition: A | Discharge status: Home / Self Care | Payer: Medicaid Other | Attending: Dermatology | Admitting: Dermatology

## 2017-01-01 ENCOUNTER — Encounter (HOSPITAL_BASED_OUTPATIENT_CLINIC_OR_DEPARTMENT_OTHER): Payer: Self-pay | Admitting: *Deleted

## 2017-01-01 DIAGNOSIS — Z5321 Procedure and treatment not carried out due to patient leaving prior to being seen by health care provider: Secondary | ICD-10-CM | POA: Diagnosis not present

## 2017-01-01 DIAGNOSIS — K5900 Constipation, unspecified: Secondary | ICD-10-CM | POA: Diagnosis not present

## 2017-01-01 DIAGNOSIS — O99612 Diseases of the digestive system complicating pregnancy, second trimester: Secondary | ICD-10-CM | POA: Diagnosis present

## 2017-01-01 DIAGNOSIS — Z3A20 20 weeks gestation of pregnancy: Secondary | ICD-10-CM | POA: Insufficient documentation

## 2017-01-01 NOTE — ED Triage Notes (Addendum)
Pt c/o constipation x 1 week and 5 months preg

## 2017-01-01 NOTE — ED Notes (Signed)
Called for pt x 3, unable to locate patient.

## 2017-04-29 ENCOUNTER — Encounter (HOSPITAL_BASED_OUTPATIENT_CLINIC_OR_DEPARTMENT_OTHER): Payer: Self-pay | Admitting: Emergency Medicine

## 2017-04-29 ENCOUNTER — Emergency Department (HOSPITAL_BASED_OUTPATIENT_CLINIC_OR_DEPARTMENT_OTHER)
Admission: EM | Admit: 2017-04-29 | Discharge: 2017-04-30 | Disposition: A | Discharge status: Home / Self Care | Payer: Medicaid Other | Attending: Emergency Medicine | Admitting: Emergency Medicine

## 2017-04-29 DIAGNOSIS — Y929 Unspecified place or not applicable: Secondary | ICD-10-CM | POA: Diagnosis not present

## 2017-04-29 DIAGNOSIS — Y999 Unspecified external cause status: Secondary | ICD-10-CM | POA: Insufficient documentation

## 2017-04-29 DIAGNOSIS — X58XXXA Exposure to other specified factors, initial encounter: Secondary | ICD-10-CM | POA: Insufficient documentation

## 2017-04-29 DIAGNOSIS — S31512A Laceration without foreign body of unspecified external genital organs, female, initial encounter: Secondary | ICD-10-CM | POA: Diagnosis not present

## 2017-04-29 DIAGNOSIS — F17200 Nicotine dependence, unspecified, uncomplicated: Secondary | ICD-10-CM | POA: Insufficient documentation

## 2017-04-29 DIAGNOSIS — Y939 Activity, unspecified: Secondary | ICD-10-CM | POA: Diagnosis not present

## 2017-04-29 DIAGNOSIS — N939 Abnormal uterine and vaginal bleeding, unspecified: Secondary | ICD-10-CM | POA: Diagnosis present

## 2017-04-29 DIAGNOSIS — S3141XA Laceration without foreign body of vagina and vulva, initial encounter: Secondary | ICD-10-CM

## 2017-04-29 DIAGNOSIS — N6459 Other signs and symptoms in breast: Secondary | ICD-10-CM

## 2017-04-29 NOTE — ED Triage Notes (Signed)
Pt reports heavy vaginal bleeding, pt is 5 days post partum after vaginal deliver. Pt reports using 5 piads since 7pm.

## 2017-04-29 NOTE — ED Provider Notes (Signed)
[facility]   CSN: [id number] Arrival date & time: 04/29/17  2216  By signing my name below, I, Thelma Barge, attest that this documentation has been prepared under the direction and in the presence of Chanley Mcenery, Mayer Masker, MD. Electronically Signed: Thelma Barge, Scribe. 04/29/17. 1:54 AM.   History   Chief Complaint Chief Complaint  Patient presents with  . Vaginal Bleeding   The history is provided by the patient. No language interpreter was used.   HPI Comments: Leah Knapp is a 21 y.o. female who presents to the Emergency Department complaining of constant heavy vaginal bleeding and vaginal pain since 5 hours. She states she has bled through 6 pads since her symptoms began. She notes she just gave birth to a full term baby with a normal vaginal delivery 5 days prior. She had some bleeding afterwards but this is much heavier. She denies abdominal pain. Pt is breastfeeding and notes her breasts are engorged and they are painful. She did sustain a first-degree tear during delivery.  History reviewed. No pertinent past medical history.  There are no active problems to display for this patient.   History reviewed. No pertinent surgical history.  OB History    Gravida Para Term Preterm AB Living   1             SAB TAB Ectopic Multiple Live Births                   Home Medications    Prior to Admission medications   Medication Sig Start Date End Date Taking? Authorizing Provider  HYDROcodone-acetaminophen (NORCO/VICODIN) 5-325 MG tablet Take 1 tablet by mouth every 6 (six) hours as needed for moderate pain.   Yes [provider]  IBUPROFEN IB PO Take by mouth.   Yes [provider]    Family History No family history on file.  Social History Social History  Substance Use Topics  . Smoking status: Current Some Day Smoker  . Smokeless tobacco: Never Used  . Alcohol use Yes     Comment: occ     Allergies   Patient  has no known allergies.   Review of Systems Review of Systems  Gastrointestinal: Negative for abdominal pain.  Genitourinary: Positive for vaginal bleeding and vaginal pain.  Skin: Positive for wound.  Neurological: Negative for dizziness.  All other systems reviewed and are negative.    Physical Exam Updated Vital Signs BP (!) 139/93 (BP Location: Right Arm)   Pulse 82   Temp 99.1 F (37.3 C) (Oral)   Resp 16   Ht 5\' 6"  (1.676 m)   Wt 71.2 kg (157 lb)   LMP 07/19/2016   SpO2 98%   BMI 25.34 kg/m   Physical Exam  Constitutional: She is oriented to person, place, and time.  Uncomfortable appearing and tearful, nontoxic  HENT:  Head: Normocephalic and atraumatic.  Cardiovascular: Normal rate and regular rhythm.   Pulmonary/Chest: Effort normal. No respiratory distress.  Abdominal: Soft. Bowel sounds are normal. There is no tenderness.  Postpartum abdomen, firm uterus  Genitourinary:  Genitourinary Comments: Brisk bleeding noted from the right side of the vaginal introitus, small tear noted, no bleeding coming from the vaginal canal, internal exam deferred given recent first-degree tear  Neurological: She is alert and oriented to person, place, and time.  Skin: Skin is warm and dry.  Psychiatric: She has a normal mood and affect.  Nursing note and vitals reviewed.    ED  Treatments / Results  DIAGNOSTIC STUDIES: Oxygen Saturation is 100% on RA, normal by my interpretation.    COORDINATION OF CARE: 11:59 PM Discussed treatment plan with pt at bedside and pt agreed to plan.  Labs (all labs ordered are listed, but only abnormal results are displayed) Labs Reviewed  URINALYSIS, ROUTINE W REFLEX MICROSCOPIC - Abnormal; Notable for the following:       Result Value   Color, Urine RED (*)    APPearance CLOUDY (*)    Hgb urine dipstick LARGE (*)    Protein, ur 30 (*)    Leukocytes, UA MODERATE (*)    All other components within normal limits  HEMOGLOBIN AND  HEMATOCRIT, BLOOD - Abnormal; Notable for the following:    HCT 35.9 (*)    All other components within normal limits  URINALYSIS, MICROSCOPIC (REFLEX) - Abnormal; Notable for the following:    Bacteria, UA FEW (*)    Squamous Epithelial / LPF 6-30 (*)    All other components within normal limits    EKG  EKG Interpretation None       Radiology No results found.  Procedures Procedures (including critical care time)  LACERATION REPAIR Performed by: Shon BatonHORTON, Jaleil Renwick F Authorized by: Shon BatonHORTON, Bayli Quesinberry F Consent: Verbal consent obtained. Risks and benefits: risks, benefits and alternatives were discussed Consent given by: patient Patient identity confirmed: provided demographic data Prepped and Draped in normal sterile fashion Wound explored  Laceration Location: vaginal introitis  Laceration Length: 0.5 cm  No Foreign Bodies seen or palpated  Anesthesia: local infiltration  Local anesthetic: lidocaine 1% w epinephrine  Anesthetic total: 2 ml  Irrigation method: syringe Amount of cleaning: standard  Skin closure: 4-0 vicryl  Number of sutures: 1  Technique: figure of 8  Patient tolerance: Patient tolerated the procedure well with no immediate complications.   Medications Ordered in ED Medications  lidocaine-EPINEPHrine (XYLOCAINE W/EPI) 2 %-1:100000 (with pres) injection 20 mL (not administered)  lidocaine-EPINEPHrine (XYLOCAINE W/EPI) 1 %-1:100000 (with pres) injection (not administered)  silver nitrate applicators 75-25 % applicator (not administered)  ketorolac (TORADOL) 30 MG/ML injection 30 mg (30 mg Intramuscular Given 04/30/17 0029)     Initial Impression / Assessment and Plan / ED Course  I have reviewed the triage vital signs and the nursing notes.  Pertinent labs & imaging results that were available during my care of the patient were reviewed by me and considered in my medical decision making (see chart for details).     Patient presents  with vaginal bleeding. She reports passage of clots and soaking multiple pads. She is nontoxic-appearing. Vital signs reassuring. On exam, she appears to be briskly bleeding from a small tear at the vaginal introitus. Further internal exam was deferred initially. Lidocaine with epi was used and a figure-of-eight stitch was placed. No additional bleeding was noted. Given that this appeared to be the likely source of bleeding, the internal exam was deferred for patient comfort given recent first-degree tear. She was observed and had no recurrence of bleeding. Hemoglobin stable at 12.2. Patient was provided a breast pump for engorgement.  After history, exam, and medical workup I feel the patient has been appropriately medically screened and is safe for discharge home. Pertinent diagnoses were discussed with the patient. Patient was given return precautions.   Final Clinical Impressions(s) / ED Diagnoses   Final diagnoses:  Vaginal laceration, initial encounter  Breast engorgement    New Prescriptions New Prescriptions   No medications on file   I  personally performed the services described in this documentation, which was scribed in my presence. The recorded information has been reviewed and is accurate.     Shon Baton, MD 04/30/17 [phone]

## 2017-04-30 LAB — URINALYSIS, ROUTINE W REFLEX MICROSCOPIC
Bilirubin Urine: NEGATIVE
Glucose, UA: NEGATIVE mg/dL
Ketones, ur: NEGATIVE mg/dL
NITRITE: NEGATIVE
PROTEIN: 30 mg/dL — AB
SPECIFIC GRAVITY, URINE: 1.019 (ref 1.005–1.030)
pH: 5.5 (ref 5.0–8.0)

## 2017-04-30 LAB — URINALYSIS, MICROSCOPIC (REFLEX)

## 2017-04-30 LAB — HEMOGLOBIN AND HEMATOCRIT, BLOOD
HEMATOCRIT: 35.9 % — AB (ref 36.0–46.0)
HEMOGLOBIN: 12.2 g/dL (ref 12.0–15.0)

## 2017-04-30 MED ORDER — SILVER NITRATE-POT NITRATE 75-25 % EX MISC
CUTANEOUS | Status: AC
  Filled 2017-04-30: qty 1

## 2017-04-30 MED ORDER — LIDOCAINE-EPINEPHRINE 1 %-1:100000 IJ SOLN
INTRAMUSCULAR | Status: AC
  Filled 2017-04-30: qty 1

## 2017-04-30 MED ORDER — KETOROLAC TROMETHAMINE 30 MG/ML IJ SOLN
30.0000 mg | Freq: Once | INTRAMUSCULAR | Status: AC
  Administered 2017-04-30: 30 mg via INTRAMUSCULAR
  Filled 2017-04-30: qty 1

## 2017-04-30 MED ORDER — LIDOCAINE-EPINEPHRINE 2 %-1:100000 IJ SOLN: 20.0000 mL | Freq: Once | INTRAMUSCULAR | Status: DC

## 2017-04-30 NOTE — ED Notes (Signed)
Pt escorted to bathroom. Pt is bleeding through undergarments. States the onset at 7pm.

## 2017-04-30 NOTE — Discharge Instructions (Signed)
You were seen today for vaginal bleeding. You had a small tear in the wall of your vaginal introitus. This was the cause of your bleeding. A stitch was placed. This will absorb on its own. Follow-up with OB/GYN.

## 2017-08-27 ENCOUNTER — Emergency Department (HOSPITAL_BASED_OUTPATIENT_CLINIC_OR_DEPARTMENT_OTHER)
Admission: EM | Admit: 2017-08-27 | Discharge: 2017-08-28 | Disposition: A | Discharge status: Home / Self Care | Payer: Medicaid Other | Attending: Emergency Medicine | Admitting: Emergency Medicine

## 2017-08-27 ENCOUNTER — Encounter (HOSPITAL_BASED_OUTPATIENT_CLINIC_OR_DEPARTMENT_OTHER): Payer: Self-pay | Admitting: Emergency Medicine

## 2017-08-27 DIAGNOSIS — F17200 Nicotine dependence, unspecified, uncomplicated: Secondary | ICD-10-CM | POA: Diagnosis not present

## 2017-08-27 DIAGNOSIS — B9689 Other specified bacterial agents as the cause of diseases classified elsewhere: Secondary | ICD-10-CM | POA: Diagnosis not present

## 2017-08-27 DIAGNOSIS — N760 Acute vaginitis: Secondary | ICD-10-CM | POA: Insufficient documentation

## 2017-08-27 DIAGNOSIS — K120 Recurrent oral aphthae: Secondary | ICD-10-CM | POA: Diagnosis not present

## 2017-08-27 DIAGNOSIS — Z711 Person with feared health complaint in whom no diagnosis is made: Secondary | ICD-10-CM

## 2017-08-27 DIAGNOSIS — Z113 Encounter for screening for infections with a predominantly sexual mode of transmission: Secondary | ICD-10-CM | POA: Insufficient documentation

## 2017-08-27 DIAGNOSIS — K1370 Unspecified lesions of oral mucosa: Secondary | ICD-10-CM | POA: Diagnosis present

## 2017-08-27 LAB — PREGNANCY, URINE: Preg Test, Ur: NEGATIVE

## 2017-08-27 LAB — URINALYSIS, MICROSCOPIC (REFLEX)

## 2017-08-27 LAB — WET PREP, GENITAL
Sperm: NONE SEEN
Trich, Wet Prep: NONE SEEN
Yeast Wet Prep HPF POC: NONE SEEN

## 2017-08-27 LAB — URINALYSIS, ROUTINE W REFLEX MICROSCOPIC
BILIRUBIN URINE: NEGATIVE
GLUCOSE, UA: NEGATIVE mg/dL
KETONES UR: NEGATIVE mg/dL
NITRITE: NEGATIVE
PH: 6 (ref 5.0–8.0)
PROTEIN: NEGATIVE mg/dL
Specific Gravity, Urine: >1.03 — ABNORMAL HIGH (ref 1.005–1.030)

## 2017-08-27 MED ORDER — CEFTRIAXONE SODIUM 250 MG IJ SOLR
250.0000 mg | Freq: Once | INTRAMUSCULAR | Status: AC
  Administered 2017-08-27: 250 mg via INTRAMUSCULAR
  Filled 2017-08-27: qty 250

## 2017-08-27 MED ORDER — VALACYCLOVIR HCL 1 G PO TABS: 1000.0000 mg | ORAL_TABLET | Freq: Two times a day (BID) | ORAL | 0 refills | Status: AC

## 2017-08-27 MED ORDER — VALACYCLOVIR HCL 500 MG PO TABS
1000.0000 mg | ORAL_TABLET | Freq: Once | ORAL | Status: AC
  Administered 2017-08-27: 1000 mg via ORAL
  Filled 2017-08-27: qty 2

## 2017-08-27 MED ORDER — AZITHROMYCIN 250 MG PO TABS
1000.0000 mg | ORAL_TABLET | Freq: Once | ORAL | Status: AC
  Administered 2017-08-27: 1000 mg via ORAL
  Filled 2017-08-27: qty 4

## 2017-08-27 NOTE — ED Notes (Signed)
C/o sore on lip x 1 week,  Also vaginal odor x 1 month w spotting brownish in color

## 2017-08-27 NOTE — ED Triage Notes (Signed)
Pt states she has a sore on the inside of her lip causing pain. Pt also reports vaginal discharge with odor.

## 2017-08-27 NOTE — ED Notes (Signed)
ED Provider at bedside. 

## 2017-08-28 MED ORDER — METRONIDAZOLE 500 MG PO TABS: 500.0000 mg | ORAL_TABLET | Freq: Two times a day (BID) | ORAL | 0 refills | Status: DC

## 2017-08-28 NOTE — ED Provider Notes (Signed)
[facility]   CSN: [id number] Arrival date & time: 08/27/17  1947     History   Chief Complaint Chief Complaint  Patient presents with  . Oral Swelling  . Vaginal Discharge    HPI Leah Knapp is a 21 y.o. female with no significant past medical history presenting with 1 week mild sore to her lower inner lip. She states it is very painful and she has tried peroxide and salt gargles without relief. She denies any improvement since. She also reports vaginal odor that is unusual but no discharge. Denies pelvic pain, abdominal pain, nausea, vomiting, diarrhea. LMP 08/16/17, G2P 1011. She does endorses exactly active but reports wearing condoms every time. She did have oral sex without barrier protection recently.  HPI  History reviewed. No pertinent past medical history.  There are no active problems to display for this patient.   History reviewed. No pertinent surgical history.  OB History    Gravida Para Term Preterm AB Living   1             SAB TAB Ectopic Multiple Live Births                   Home Medications    Prior to Admission medications   Medication Sig Start Date End Date Taking? Authorizing Provider  IBUPROFEN IB PO Take by mouth.    [provider]  metroNIDAZOLE (FLAGYL) 500 MG tablet Take 1 tablet (500 mg total) by mouth 2 (two) times daily. 08/28/17   Georgiana Shore, PA-C  valACYclovir (VALTREX) 1000 MG tablet Take 1 tablet (1,000 mg total) by mouth 2 (two) times daily. 08/27/17 09/03/17  Georgiana Shore, PA-C    Family History No family history on file.  Social History Social History  Substance Use Topics  . Smoking status: Current Some Day Smoker  . Smokeless tobacco: Never Used  . Alcohol use Yes     Comment: occ     Allergies   Patient has no known allergies.   Review of Systems Review of Systems  Constitutional: Negative for chills and fever.  HENT: Negative for facial swelling, sore  throat, trouble swallowing and voice change.   Eyes: Negative for pain and visual disturbance.  Respiratory: Negative for cough, choking, shortness of breath, wheezing and stridor.   Cardiovascular: Negative for chest pain and palpitations.  Gastrointestinal: Negative for abdominal pain, nausea and vomiting.  Genitourinary: Positive for vaginal bleeding. Negative for difficulty urinating, dysuria, flank pain, frequency, hematuria, pelvic pain, vaginal discharge and vaginal pain.       Patient had nexplanon implanted in August and has been spotting since.  Musculoskeletal: Negative for arthralgias, back pain, myalgias, neck pain and neck stiffness.  Skin: Negative for color change, pallor and rash.  Neurological: Negative for dizziness, seizures, syncope, weakness, light-headedness and headaches.     Physical Exam Updated Vital Signs BP (!) 117/97 (BP Location: Right Arm)   Pulse 96   Temp 98.6 F (37 C) (Oral)   Resp 16   Ht  (1.676 m)   Wt 68 kg (150 lb)   SpO2 100%   BMI 24.21 kg/m   Physical Exam  Constitutional: She is oriented to person, place, and time. She appears well-developed and well-nourished. No distress.  Afebrile, nontoxic-appearing, sitting comfortably in bed in no acute distress.  HENT:  Head: Normocephalic and atraumatic.  Eyes: Conjunctivae and EOM are normal.  Neck: Normal range of motion. Neck supple.  Cardiovascular: Normal rate, regular rhythm and normal heart sounds.   No murmur heard. Pulmonary/Chest: Effort normal and breath sounds normal. No respiratory distress. She has no wheezes. She has no rales.  Abdominal: Soft. She exhibits no distension and no mass. There is no tenderness. There is no guarding.  Genitourinary:  Genitourinary Comments: Cervix is friable, copious discharge with dark blood. No cervical motion tenderness. No adnexal mass or tenderness palpation  Musculoskeletal: Normal range of motion. She exhibits no edema or deformity.    Neurological: She is alert and oriented to person, place, and time.  Skin: Skin is warm and dry. No rash noted. She is not diaphoretic. No erythema. No pallor.  Psychiatric: She has a normal mood and affect.  Nursing note and vitals reviewed.    ED Treatments / Results  Labs (all labs ordered are listed, but only abnormal results are displayed) Labs Reviewed  WET PREP, GENITAL - Abnormal; Notable for the following:       Result Value   Clue Cells Wet Prep HPF POC PRESENT (*)    WBC, Wet Prep HPF POC FEW (*)    All other components within normal limits  URINALYSIS, ROUTINE W REFLEX MICROSCOPIC - Abnormal; Notable for the following:    Specific Gravity, Urine >1.030 (*)    Hgb urine dipstick LARGE (*)    Leukocytes, UA TRACE (*)    All other components within normal limits  URINALYSIS, MICROSCOPIC (REFLEX) - Abnormal; Notable for the following:    Bacteria, UA RARE (*)    Squamous Epithelial / LPF 0-5 (*)    All other components within normal limits  PREGNANCY, URINE  HSV 1 ANTIBODY, IGG  HSV 2 ANTIBODY, IGG  GC/CHLAMYDIA PROBE AMP ([place]) NOT AT [facility]    EKG  EKG Interpretation None       Radiology No results found.  Procedures Procedures (including critical care time)  Medications Ordered in ED Medications  azithromycin (ZITHROMAX) tablet 1,000 mg (1,000 mg Oral Given 08/27/17 2331)  cefTRIAXone (ROCEPHIN) injection 250 mg (250 mg Intramuscular Given 08/27/17 2333)  valACYclovir (VALTREX) tablet 1,000 mg (1,000 mg Oral Given 08/27/17 2331)     Initial Impression / Assessment and Plan / ED Course  I have reviewed the triage vital signs and the nursing notes.  Pertinent labs & imaging results that were available during my care of the patient were reviewed by me and considered in my medical decision making (see chart for details).    Patient presenting with oral mucosa ulcer and history of unprotected oral sex.  Titer for HSV 1 and 2 were obtained. Pelvic  exam reveals a friable cervix and copious discharge. She was treated with ceftriaxone IM and azithromycin as well as the first dose of valacyclovir. Dc with 7 days.  Wet prep with evidence of bacterial vaginosis. Patient discharged home with Flagyl and advised to avoid alcohol while on this medication.  Discharge home with close follow-up with GYN and PCP. Advised patient to avoid sexual intercourse until completion of treatment and resolution of symptoms.  Final Clinical Impressions(s) / ED Diagnoses   Final diagnoses:  Canker sores oral  Concern about STD in female without diagnosis  BV (bacterial vaginosis)    New Prescriptions Discharge Medication List as of 08/28/2017 12:11 AM    START taking these medications   Details  metroNIDAZOLE (FLAGYL) 500 MG tablet Take 1 tablet (500 mg total) by mouth 2 (two) times daily., Starting Thu 08/28/2017, Print    valACYclovir (  VALTREX) 1000 MG tablet Take 1 tablet (1,000 mg total) by mouth 2 (two) times daily., Starting Wed 08/27/2017, Until Wed 09/03/2017, Print         Georgiana Shore, PA-C 08/28/17 4010    Rolland Porter, MD 09/09/17 2032

## 2017-08-28 NOTE — Discharge Instructions (Signed)
As discussed, take entire course of antibiotics even if you feel better. Avoid alcohol while taking this medication as it can make you very ill.  Follow-up with GYN at the [facility] outpatient clinic. Avoid sexual intercourse for at least 7 days. you will receive a phone call if anything returns positive on culture and your partner will need to be treated as well.

## 2017-08-29 LAB — GC/CHLAMYDIA PROBE AMP (~~LOC~~) NOT AT ARMC
Chlamydia: NEGATIVE
Neisseria Gonorrhea: NEGATIVE

## 2017-08-29 LAB — HSV 1 ANTIBODY, IGG: HSV 1 GLYCOPROTEIN G AB, IGG: 19.7 {index} — AB (ref 0.00–0.90)

## 2017-08-29 LAB — HSV 2 ANTIBODY, IGG

## 2017-11-28 ENCOUNTER — Encounter (HOSPITAL_BASED_OUTPATIENT_CLINIC_OR_DEPARTMENT_OTHER): Payer: Self-pay

## 2017-11-28 ENCOUNTER — Emergency Department (HOSPITAL_BASED_OUTPATIENT_CLINIC_OR_DEPARTMENT_OTHER)
Admission: EM | Admit: 2017-11-28 | Discharge: 2017-11-28 | Disposition: A | Discharge status: Home / Self Care | Payer: Medicaid Other | Attending: Emergency Medicine | Admitting: Emergency Medicine

## 2017-11-28 ENCOUNTER — Emergency Department (HOSPITAL_BASED_OUTPATIENT_CLINIC_OR_DEPARTMENT_OTHER): Payer: Medicaid Other

## 2017-11-28 ENCOUNTER — Other Ambulatory Visit: Payer: Self-pay

## 2017-11-28 DIAGNOSIS — Y929 Unspecified place or not applicable: Secondary | ICD-10-CM | POA: Insufficient documentation

## 2017-11-28 DIAGNOSIS — Y9389 Activity, other specified: Secondary | ICD-10-CM | POA: Insufficient documentation

## 2017-11-28 DIAGNOSIS — W503XXA Accidental bite by another person, initial encounter: Secondary | ICD-10-CM | POA: Insufficient documentation

## 2017-11-28 DIAGNOSIS — Y999 Unspecified external cause status: Secondary | ICD-10-CM | POA: Diagnosis not present

## 2017-11-28 DIAGNOSIS — F17200 Nicotine dependence, unspecified, uncomplicated: Secondary | ICD-10-CM | POA: Diagnosis not present

## 2017-11-28 DIAGNOSIS — N898 Other specified noninflammatory disorders of vagina: Secondary | ICD-10-CM | POA: Diagnosis not present

## 2017-11-28 DIAGNOSIS — M79642 Pain in left hand: Secondary | ICD-10-CM | POA: Diagnosis present

## 2017-11-28 LAB — WET PREP, GENITAL
Clue Cells Wet Prep HPF POC: NONE SEEN
Sperm: NONE SEEN
TRICH WET PREP: NONE SEEN
Yeast Wet Prep HPF POC: NONE SEEN

## 2017-11-28 LAB — PREGNANCY, URINE: Preg Test, Ur: NEGATIVE

## 2017-11-28 LAB — URINALYSIS, ROUTINE W REFLEX MICROSCOPIC
Bilirubin Urine: NEGATIVE
Glucose, UA: NEGATIVE mg/dL
Hgb urine dipstick: NEGATIVE
KETONES UR: NEGATIVE mg/dL
Leukocytes, UA: NEGATIVE
NITRITE: NEGATIVE
PH: 6 (ref 5.0–8.0)
Protein, ur: NEGATIVE mg/dL
Specific Gravity, Urine: >1.03 — ABNORMAL HIGH (ref 1.005–1.030)

## 2017-11-28 MED ORDER — AMOXICILLIN-POT CLAVULANATE 875-125 MG PO TABS: 1.0000 | ORAL_TABLET | Freq: Two times a day (BID) | ORAL | 0 refills | Status: AC

## 2017-11-28 MED FILL — AMOX-CLAV 875-125 MG TABLET: 875-125 | 7 days supply | Qty: 14 | Fill #0
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
## 2017-11-28 NOTE — ED Notes (Signed)
Patient went to Xray. ?

## 2017-12-02 LAB — GC/CHLAMYDIA PROBE AMP (~~LOC~~) NOT AT ARMC
Chlamydia: NEGATIVE
Neisseria Gonorrhea: NEGATIVE

## 2018-10-17 ENCOUNTER — Other Ambulatory Visit: Payer: Self-pay

## 2018-10-17 ENCOUNTER — Emergency Department (HOSPITAL_BASED_OUTPATIENT_CLINIC_OR_DEPARTMENT_OTHER)
Admission: EM | Admit: 2018-10-17 | Discharge: 2018-10-17 | Disposition: A | Discharge status: Home / Self Care | Payer: Medicaid Other | Attending: Emergency Medicine | Admitting: Emergency Medicine

## 2018-10-17 ENCOUNTER — Encounter (HOSPITAL_BASED_OUTPATIENT_CLINIC_OR_DEPARTMENT_OTHER): Payer: Self-pay | Admitting: *Deleted

## 2018-10-17 DIAGNOSIS — J069 Acute upper respiratory infection, unspecified: Secondary | ICD-10-CM | POA: Diagnosis not present

## 2018-10-17 DIAGNOSIS — R509 Fever, unspecified: Secondary | ICD-10-CM | POA: Diagnosis not present

## 2018-10-17 DIAGNOSIS — F17210 Nicotine dependence, cigarettes, uncomplicated: Secondary | ICD-10-CM | POA: Insufficient documentation

## 2018-10-17 DIAGNOSIS — B9789 Other viral agents as the cause of diseases classified elsewhere: Secondary | ICD-10-CM

## 2018-10-17 DIAGNOSIS — R05 Cough: Secondary | ICD-10-CM | POA: Diagnosis present

## 2018-10-17 LAB — URINALYSIS, ROUTINE W REFLEX MICROSCOPIC
BILIRUBIN URINE: NEGATIVE
Glucose, UA: NEGATIVE mg/dL
HGB URINE DIPSTICK: NEGATIVE
Ketones, ur: NEGATIVE mg/dL
Leukocytes, UA: NEGATIVE
NITRITE: NEGATIVE
Protein, ur: NEGATIVE mg/dL
Specific Gravity, Urine: 1.025 (ref 1.005–1.030)
pH: 6 (ref 5.0–8.0)

## 2018-10-17 LAB — PREGNANCY, URINE: PREG TEST UR: NEGATIVE

## 2018-10-17 MED ORDER — ACETAMINOPHEN 325 MG PO TABS
650.0000 mg | ORAL_TABLET | Freq: Once | ORAL | Status: AC
  Administered 2018-10-17: 650 mg via ORAL
  Filled 2018-10-17: qty 2

## 2018-10-17 NOTE — ED Triage Notes (Signed)
Runny nose, cough, sneezing, nausea x 4 days.

## 2018-10-17 NOTE — Discharge Instructions (Signed)
Make sure you are getting plenty of rest.  Take tylenol or ibuprofen for fever.  Drink plenty of fluids and should start feeling better in the next 3-4 days.  Also urine pregnancy test is negative.  You are also about to start your period which is why you are having nausea and bloating.

## 2018-10-17 NOTE — ED Provider Notes (Signed)
[facility] EMERGENCY DEPARTMENT Provider Note   CSN: [id number] Arrival date & time: 10/17/18  1835     History   Chief Complaint Chief Complaint  Patient presents with  . Cough    HPI Leah Knapp is a 22 y.o. female.  The history is provided by the patient.  Cough  This is a new problem. Episode onset: 5-6 days. The problem occurs constantly. The problem has not changed since onset.The cough is non-productive. Maximum temperature: subjective. The fever has been present for 3 to 4 days. Associated symptoms include chills, sweats, ear pain, headaches, rhinorrhea, sore throat and myalgias. She has tried nothing for the symptoms. The treatment provided no relief. She is a smoker. Her past medical history does not include asthma. Past medical history comments: son with same symptoms.    History reviewed. No pertinent past medical history.  There are no active problems to display for this patient.   History reviewed. No pertinent surgical history.   OB History    Gravida  1   Para      Term      Preterm      AB      Living        SAB      TAB      Ectopic      Multiple      Live Births               Home Medications    Prior to Admission medications   Medication Sig Start Date End Date Taking? Authorizing Provider  amoxicillin-clavulanate (AUGMENTIN) 875-125 MG tablet Take 1 tablet by mouth every 12 (twelve) hours. 11/28/17   Tilden Fossa, MD    Family History No family history on file.  Social History Social History   Tobacco Use  . Smoking status: Current Some Day Smoker  . Smokeless tobacco: Never Used  Substance Use Topics  . Alcohol use: Yes    Comment: occ  . Drug use: No     Allergies   Patient has no known allergies.   Review of Systems Review of Systems  Constitutional: Positive for chills.  HENT: Positive for ear pain, rhinorrhea and sore throat.   Respiratory: Positive for cough.   Genitourinary:     LMP was Oct 10th.  INtermittent abd bloating this week and intermittent nausea.  No dysuria, frequency, urgency or vaginal discharge  Musculoskeletal: Positive for myalgias.  Neurological: Positive for headaches.  All other systems reviewed and are negative.    Physical Exam Updated Vital Signs BP 119/72   Pulse (!) 110   Temp 99.5 F (37.5 C) (Oral)   Resp 20   Ht 5\' 6"  (1.676 m)   Wt 63.5 kg   LMP 09/17/2018   SpO2 100%   BMI 22.60 kg/m   Physical Exam  Constitutional: She is oriented to person, place, and time. She appears well-developed and well-nourished. No distress.  HENT:  Head: Normocephalic and atraumatic.  Right Ear: Tympanic membrane and ear canal normal.  Left Ear: Tympanic membrane and ear canal normal.  Nose: Mucosal edema and rhinorrhea present.  Mouth/Throat: Mucous membranes are normal. Posterior oropharyngeal erythema present. No oropharyngeal exudate or posterior oropharyngeal edema. No tonsillar exudate.  Eyes: Pupils are equal, round, and reactive to light. Conjunctivae and EOM are normal.  Neck: Normal range of motion. Neck supple.  Cardiovascular: Regular rhythm and intact distal pulses. Tachycardia present.  No murmur heard. Pulmonary/Chest: Effort normal and breath sounds  normal. No respiratory distress. She has no wheezes. She has no rales.  Abdominal: Soft. She exhibits no distension. There is no tenderness. There is no rebound and no guarding.  Musculoskeletal: Normal range of motion. She exhibits no edema or tenderness.  Neurological: She is alert and oriented to person, place, and time.  Skin: Skin is warm and dry. No rash noted. No erythema.  Psychiatric: She has a normal mood and affect. Her behavior is normal.  Nursing note and vitals reviewed.    ED Treatments / Results  Labs (all labs ordered are listed, but only abnormal results are displayed) Labs Reviewed  URINALYSIS, ROUTINE W REFLEX MICROSCOPIC - Abnormal; Notable for the  following components:      Result Value   APPearance CLOUDY (*)    All other components within normal limits  URINE CULTURE  PREGNANCY, URINE    EKG None  Radiology No results found.  Procedures Procedures (including critical care time)  Medications Ordered in ED Medications  acetaminophen (TYLENOL) tablet 650 mg (650 mg Oral Given 10/17/18 1856)     Initial Impression / Assessment and Plan / ED Course  I have reviewed the triage vital signs and the nursing notes.  Pertinent labs & imaging results that were available during my care of the patient were reviewed by me and considered in my medical decision making (see chart for details).     Pt with symptoms consistent with viral URI.  Well appearing here.  No signs of breathing difficulty  No signs of pharyngitis, otitis or abnormal abdominal findings.  Son with same symptoms.  Pt also c/o of abd bloating and intermittent nausea and UPT is neg.  Most likely premenstraul symptoms. pt to return with any further problems.   Final Clinical Impressions(s) / ED Diagnoses   Final diagnoses:  Viral URI with cough    ED Discharge Orders    None       Gwyneth Sprout, MD 10/17/18 2110

## 2018-10-19 LAB — URINE CULTURE

## 2019-08-03 IMAGING — CR DG HAND COMPLETE 3+V*L*
3 series · 3 of 3 positions shown · non-contrast
Comparison: None.

CLINICAL DATA: Bite injury yesterday, swelling and pain today.

EXAM:
LEFT HAND - COMPLETE 3+ VIEW

[x hand pa left]
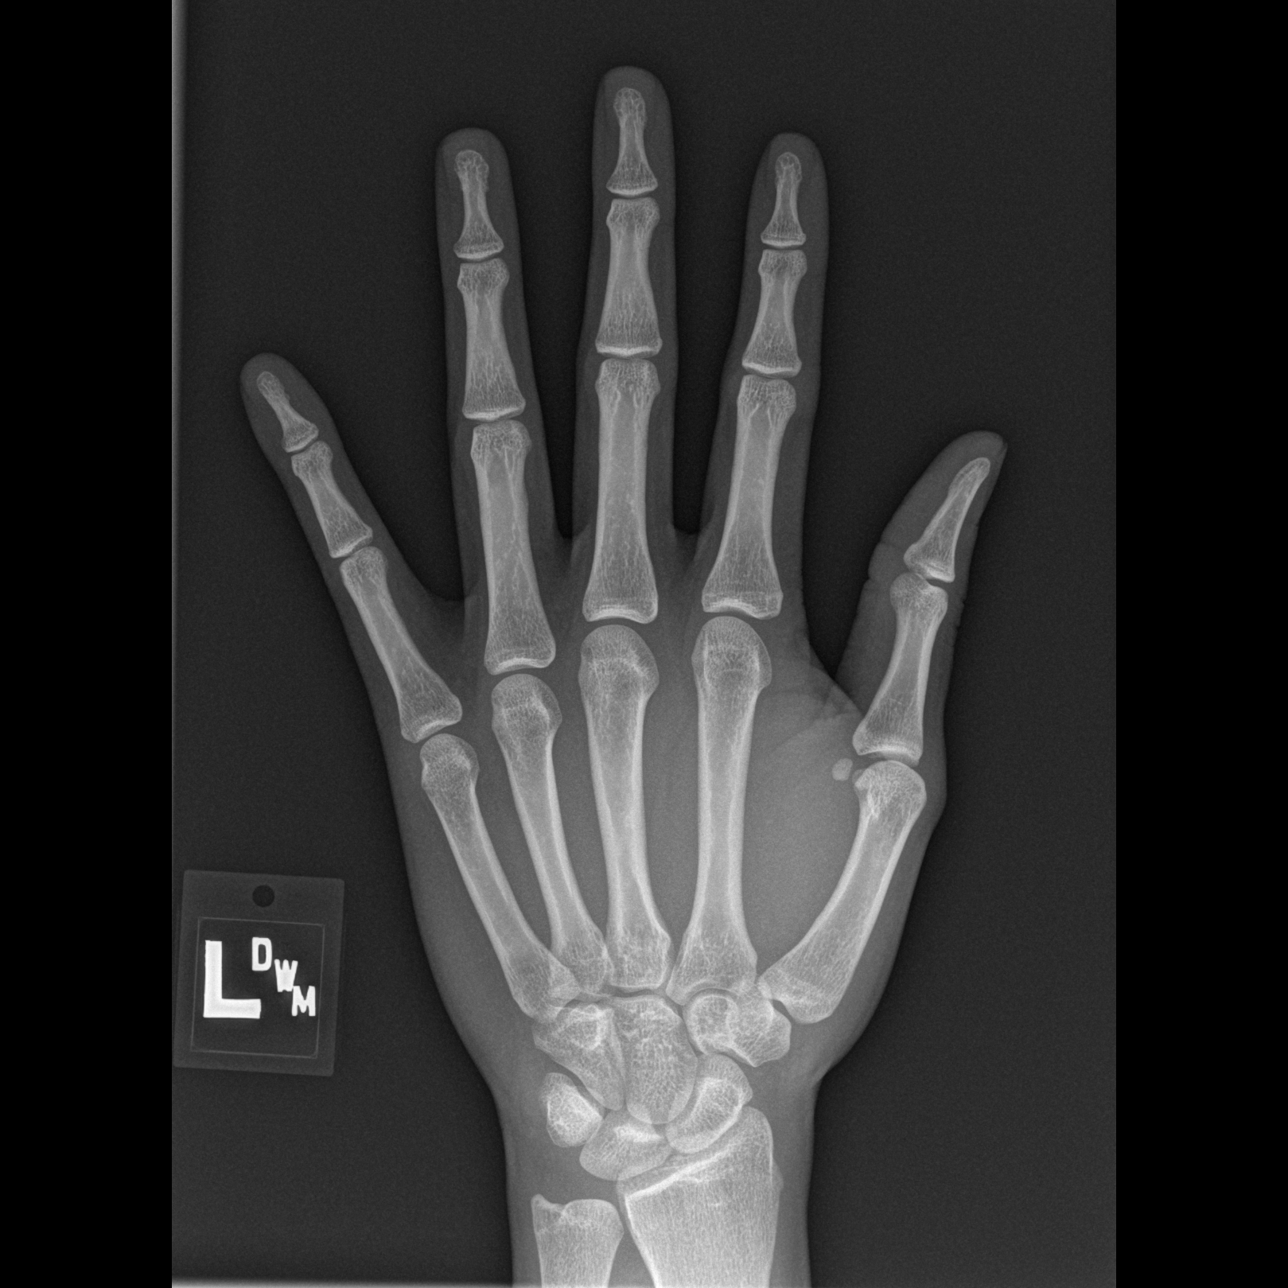

[x hand oblique left]
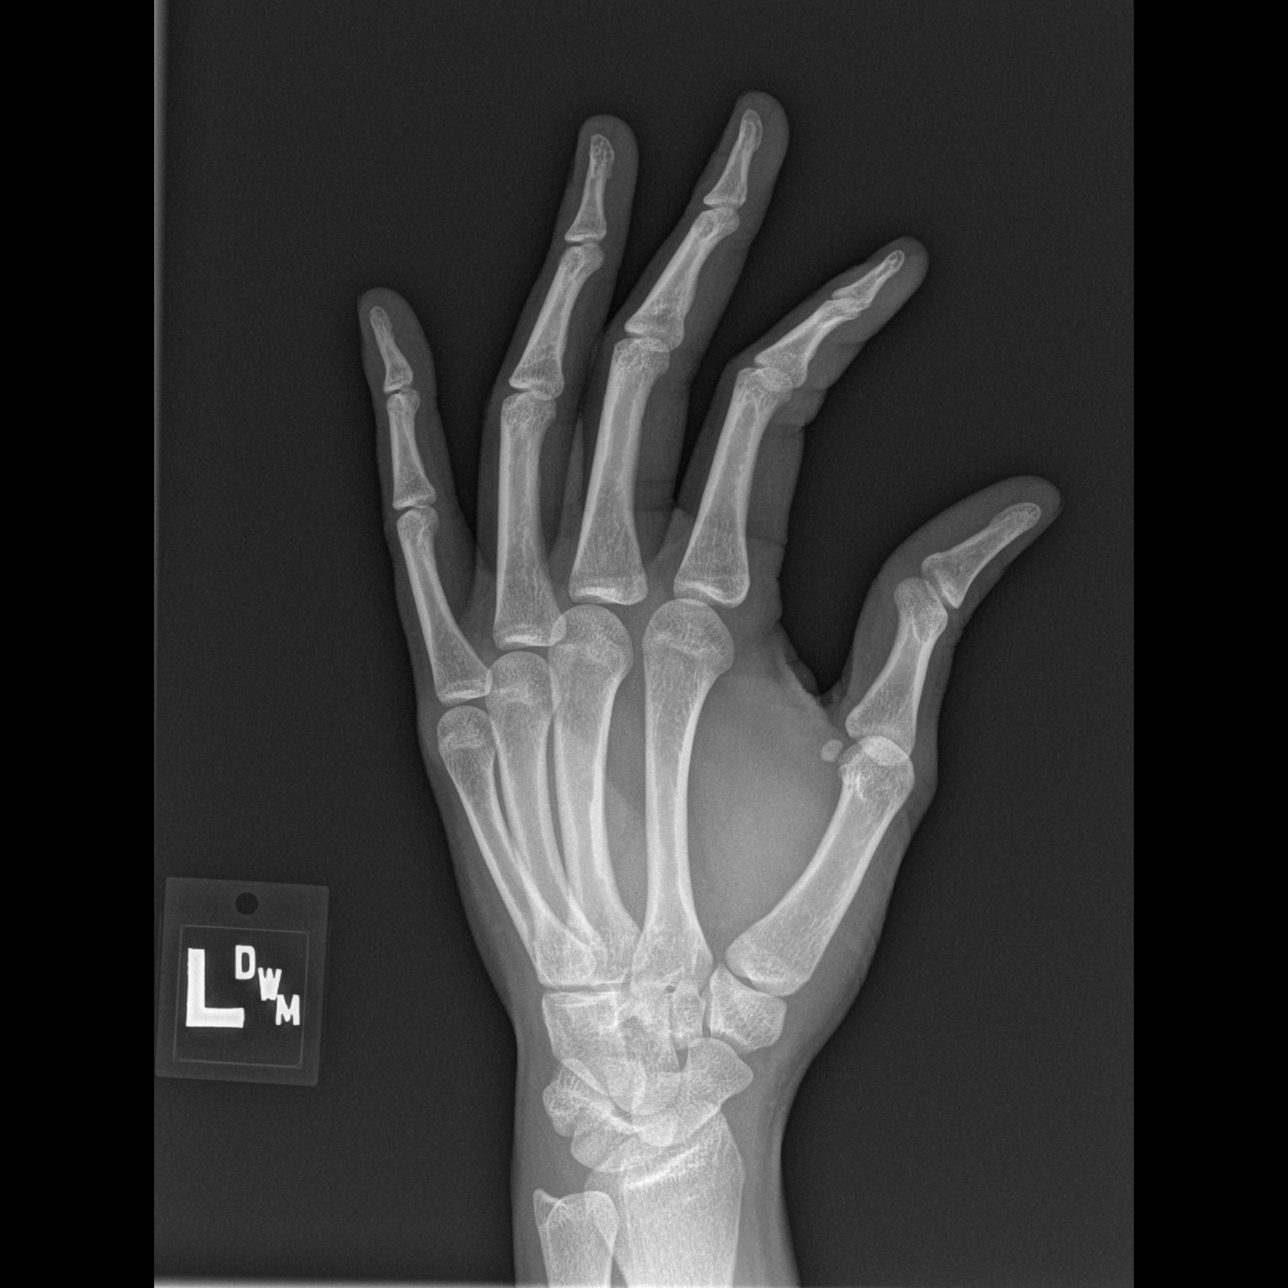

[x hand lat left]
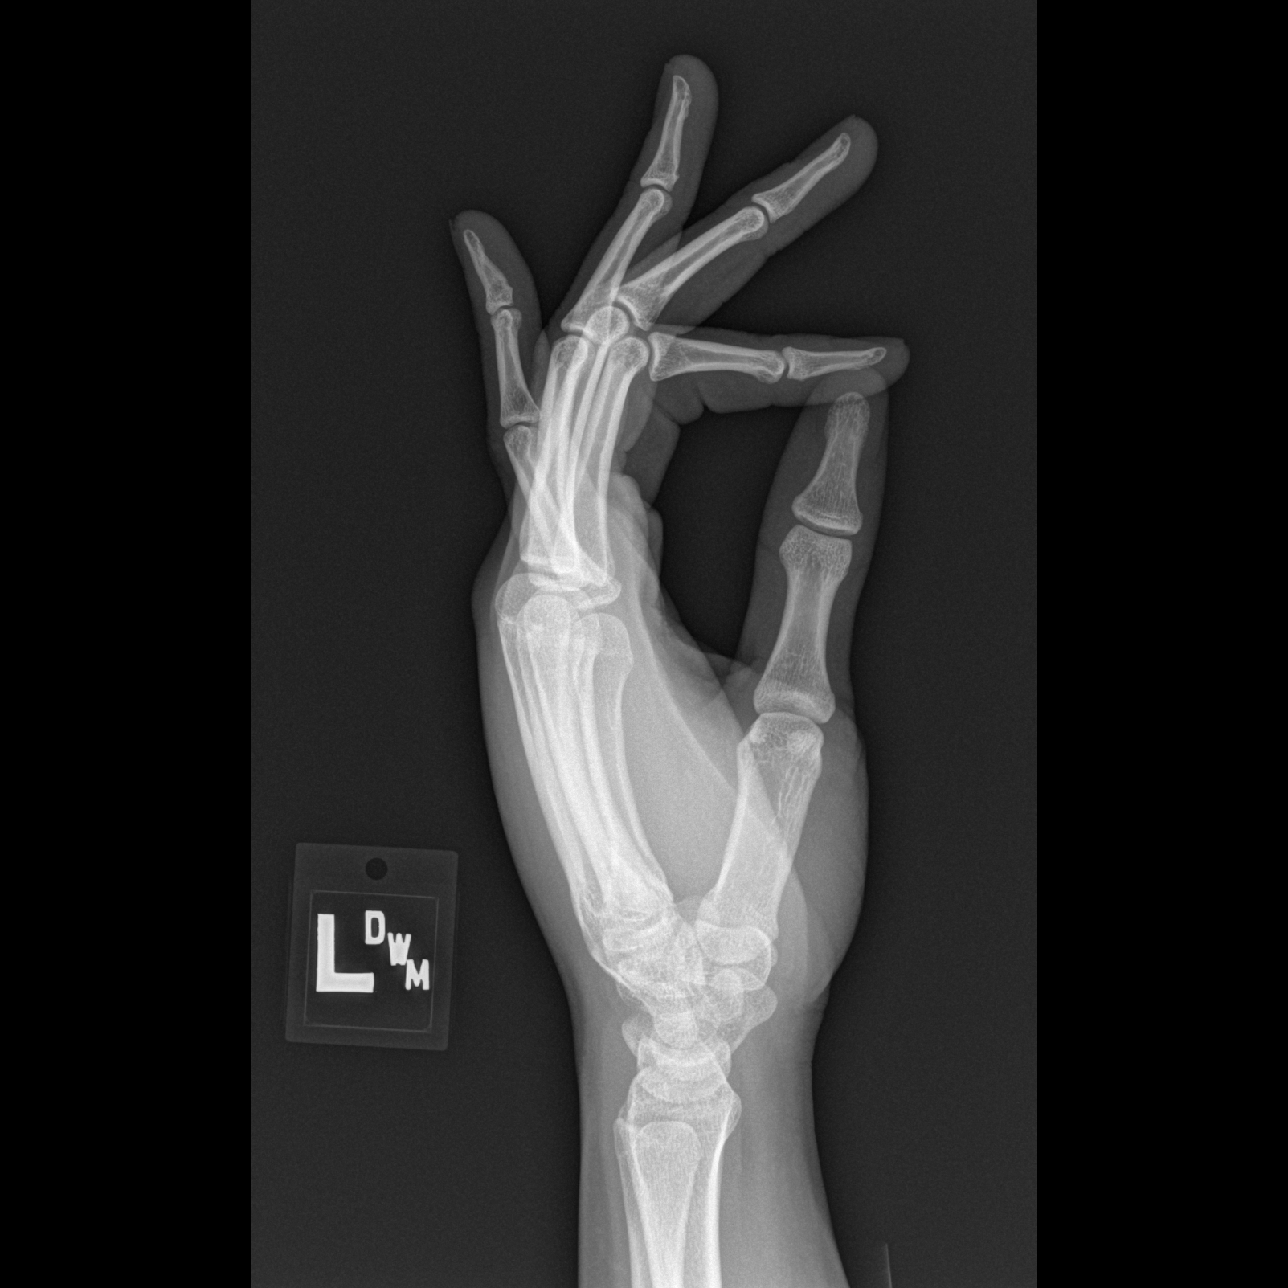

[3 of 3 positions shown; findings below may reference images not displayed]

FINDINGS: Osseous alignment is normal. No fracture line or displaced fracture
fragment seen. Soft tissues about the left hand are unremarkable. No
radiodense foreign body appreciated.
IMPRESSION: Negative.

## 2019-12-26 ENCOUNTER — Encounter (HOSPITAL_BASED_OUTPATIENT_CLINIC_OR_DEPARTMENT_OTHER): Payer: Self-pay | Admitting: Emergency Medicine

## 2019-12-26 ENCOUNTER — Emergency Department (HOSPITAL_BASED_OUTPATIENT_CLINIC_OR_DEPARTMENT_OTHER)
Admission: EM | Admit: 2019-12-26 | Discharge: 2019-12-26 | Disposition: A | Discharge status: Home / Self Care | Payer: Medicaid Other | Attending: Emergency Medicine | Admitting: Emergency Medicine

## 2019-12-26 ENCOUNTER — Other Ambulatory Visit: Payer: Self-pay

## 2019-12-26 DIAGNOSIS — S61411A Laceration without foreign body of right hand, initial encounter: Secondary | ICD-10-CM

## 2019-12-26 DIAGNOSIS — Y999 Unspecified external cause status: Secondary | ICD-10-CM | POA: Insufficient documentation

## 2019-12-26 DIAGNOSIS — Y9389 Activity, other specified: Secondary | ICD-10-CM | POA: Diagnosis not present

## 2019-12-26 DIAGNOSIS — F17200 Nicotine dependence, unspecified, uncomplicated: Secondary | ICD-10-CM | POA: Insufficient documentation

## 2019-12-26 DIAGNOSIS — Y929 Unspecified place or not applicable: Secondary | ICD-10-CM | POA: Insufficient documentation

## 2019-12-26 DIAGNOSIS — S6991XA Unspecified injury of right wrist, hand and finger(s), initial encounter: Secondary | ICD-10-CM | POA: Diagnosis present

## 2019-12-26 DIAGNOSIS — W268XXA Contact with other sharp object(s), not elsewhere classified, initial encounter: Secondary | ICD-10-CM | POA: Insufficient documentation

## 2019-12-26 MED ORDER — IBUPROFEN 400 MG PO TABS
600.0000 mg | ORAL_TABLET | Freq: Once | ORAL | Status: AC
  Administered 2019-12-26: 600 mg via ORAL
  Filled 2019-12-26: qty 1

## 2019-12-26 NOTE — ED Provider Notes (Signed)
[facility] EMERGENCY DEPARTMENT Provider Note   CSN: [id number] Arrival date & time: 12/26/19  1707     History Chief Complaint  Patient presents with  . Laceration    Leah Knapp is a 24 y.o. female presenting for evaluation of right hand laceration.  Patient states approximately 6 hours prior to arrival she was opening a can when she cut the webbing between her thumb and index finger.  She reports acute onset pain.  She wrapped it with a short, did not do anything else.  She denies numbness or tingling.  She has not taken anything including Tylenol ibuprofen.  No radiation of the pain.  She has no medical problems, takes no medications daily.  She is not on blood thinners.  Patient states her tetanus is up-to-date.  HPI     History reviewed. No pertinent past medical history.  There are no problems to display for this patient.   History reviewed. No pertinent surgical history.   OB History    Gravida  1   Para      Term      Preterm      AB      Living        SAB      TAB      Ectopic      Multiple      Live Births              No family history on file.  Social History   Tobacco Use  . Smoking status: Current Some Day Smoker  . Smokeless tobacco: Never Used  Substance Use Topics  . Alcohol use: Yes    Comment: occ  . Drug use: No    Home Medications Prior to Admission medications   Medication Sig Start Date End Date Taking? Authorizing Provider  amoxicillin-clavulanate (AUGMENTIN) 875-125 MG tablet Take 1 tablet by mouth every 12 (twelve) hours. 11/28/17   Tilden Fossa, MD    Allergies    Patient has no known allergies.  Review of Systems   Review of Systems  Skin: Positive for wound.  Hematological: Does not bruise/bleed easily.    Physical Exam Updated Vital Signs BP 132/73 (BP Location: Right Arm)   Pulse 82   Temp 99.3 F (37.4 C) (Oral)   Resp 16   Ht 5\' 6"  (1.676 m)   Wt 59 kg   LMP 12/26/2019    SpO2 99%   BMI 20.98 kg/m   Physical Exam Vitals and nursing note reviewed.  Constitutional:      General: She is not in acute distress.    Appearance: She is well-developed.  HENT:     Head: Normocephalic and atraumatic.  Pulmonary:     Effort: Pulmonary effort is normal.  Abdominal:     General: There is no distension.  Musculoskeletal:        General: Normal range of motion.       Hands:     Cervical back: Normal range of motion.     Comments: Small, C-shaped laceration at the base of the right thumb.  Laceration is superficial. No active bleeding.  No gaping.   Full active range of motion of the thumb without difficulty.  Good distal sensation and cap refill.  Skin:    General: Skin is warm.     Findings: No rash.  Neurological:     Mental Status: She is alert and oriented to person, place, and time.  ED Results / Procedures / Treatments   Labs (all labs ordered are listed, but only abnormal results are displayed) Labs Reviewed - No data to display  EKG None  Radiology No results found.  Procedures Procedures (including critical care time)  Medications Ordered in ED Medications  ibuprofen (ADVIL) tablet 600 mg (600 mg Oral Given 12/26/19 1815)    ED Course  I have reviewed the triage vital signs and the nursing notes.  Pertinent labs & imaging results that were available during my care of the patient were reviewed by me and considered in my medical decision making (see chart for details).    MDM Rules/Calculators/A&P                      Pt presenting for evaluation of hand laceration.  Physical exam shows patient who is neurovascularly intact.  Laceration is superficial without active bleeding.  Does not require stitches.  I irrigated the wound and applied a bandage.  Discussed aftercare including how to clean the wound.  At this time, patient appears safe for discharge.  Return precautions given.  Patient states she understands and agrees to  plan.  Final Clinical Impression(s) / ED Diagnoses Final diagnoses:  Laceration of right hand without foreign body, initial encounter    Rx / DC Orders ED Discharge Orders    None       Franchot Heidelberg, PA-C 12/26/19 Lorie Phenix, MD 12/26/19 2357

## 2019-12-26 NOTE — Discharge Instructions (Signed)
Wash daily with soap and water.  Keep covered if cut is open. Return to the emergency room if you develop severe worsening pain, pus draining from the area, inability move your thumb, numbness of your thumb, any new, worsening, or concerning symptoms.

## 2019-12-26 NOTE — ED Triage Notes (Signed)
Laceration between R thumb and 1st finger on a can.

## 2020-02-21 ENCOUNTER — Encounter (HOSPITAL_BASED_OUTPATIENT_CLINIC_OR_DEPARTMENT_OTHER): Payer: Self-pay | Admitting: Emergency Medicine

## 2020-02-21 ENCOUNTER — Emergency Department (HOSPITAL_BASED_OUTPATIENT_CLINIC_OR_DEPARTMENT_OTHER)
Admission: EM | Admit: 2020-02-21 | Discharge: 2020-02-21 | Disposition: A | Discharge status: Home / Self Care | Payer: Medicaid Other | Attending: Emergency Medicine | Admitting: Emergency Medicine

## 2020-02-21 ENCOUNTER — Emergency Department (HOSPITAL_BASED_OUTPATIENT_CLINIC_OR_DEPARTMENT_OTHER): Payer: Medicaid Other

## 2020-02-21 ENCOUNTER — Other Ambulatory Visit: Payer: Self-pay

## 2020-02-21 DIAGNOSIS — Z113 Encounter for screening for infections with a predominantly sexual mode of transmission: Secondary | ICD-10-CM | POA: Diagnosis not present

## 2020-02-21 DIAGNOSIS — N946 Dysmenorrhea, unspecified: Secondary | ICD-10-CM | POA: Diagnosis not present

## 2020-02-21 DIAGNOSIS — N939 Abnormal uterine and vaginal bleeding, unspecified: Secondary | ICD-10-CM | POA: Diagnosis present

## 2020-02-21 DIAGNOSIS — Z711 Person with feared health complaint in whom no diagnosis is made: Secondary | ICD-10-CM

## 2020-02-21 DIAGNOSIS — R1030 Lower abdominal pain, unspecified: Secondary | ICD-10-CM

## 2020-02-21 DIAGNOSIS — F17210 Nicotine dependence, cigarettes, uncomplicated: Secondary | ICD-10-CM | POA: Insufficient documentation

## 2020-02-21 LAB — WET PREP, GENITAL
Clue Cells Wet Prep HPF POC: NONE SEEN
Sperm: NONE SEEN
Trich, Wet Prep: NONE SEEN
Yeast Wet Prep HPF POC: NONE SEEN

## 2020-02-21 LAB — URINALYSIS, MICROSCOPIC (REFLEX): RBC / HPF: >50 RBC/hpf (ref 0–5)

## 2020-02-21 LAB — URINALYSIS, ROUTINE W REFLEX MICROSCOPIC
Bilirubin Urine: NEGATIVE
Glucose, UA: NEGATIVE mg/dL
Ketones, ur: NEGATIVE mg/dL
Leukocytes,Ua: NEGATIVE
Nitrite: NEGATIVE
Protein, ur: NEGATIVE mg/dL
Specific Gravity, Urine: >1.03 — ABNORMAL HIGH (ref 1.005–1.030)
pH: 6 (ref 5.0–8.0)

## 2020-02-21 LAB — PREGNANCY, URINE: Preg Test, Ur: NEGATIVE

## 2020-02-21 NOTE — ED Provider Notes (Signed)
[facility] EMERGENCY DEPARTMENT Provider Note   CSN: [id number] Arrival date & time: 02/21/20  1227     History Chief Complaint  Patient presents with  . Vaginal Bleeding    Leah Knapp is a 24 y.o. female presents to ED with a chief complaint of vaginal bleeding.  2 days ago started having vaginal bleeding that is similar to her prior periods.  She did miss her menstrual cycle in February and last one was in January 2021.  She is concerned that she could be pregnant or having a miscarriage.  She is sexually active without protection would like to be checked for STDs.  She denies vaginal discharge.  She is also concerned that there could be a tampon stuck in her vagina from her cycle 2 months ago.  She cannot feel the tampon but would like to be checked.  Reports lower abdominal cramping sensation as well.  Denies any vomiting, diarrhea, fever, shortness of breath, dysuria.  She has had 1 prior pregnancy without complications.  HPI     History reviewed. No pertinent past medical history.  There are no problems to display for this patient.   History reviewed. No pertinent surgical history.   OB History    Gravida  1   Para      Term      Preterm      AB      Living        SAB      TAB      Ectopic      Multiple      Live Births              No family history on file.  Social History   Tobacco Use  . Smoking status: Current Some Day Smoker  . Smokeless tobacco: Never Used  Substance Use Topics  . Alcohol use: Yes    Comment: occ  . Drug use: No    Home Medications Prior to Admission medications   Medication Sig Start Date End Date Taking? Authorizing Provider  amoxicillin-clavulanate (AUGMENTIN) 875-125 MG tablet Take 1 tablet by mouth every 12 (twelve) hours. 11/28/17   Tilden Fossa, MD    Allergies    Patient has no known allergies.  Review of Systems   Review of Systems  Constitutional: Negative for appetite change,  chills and fever.  HENT: Negative for ear pain, rhinorrhea, sneezing and sore throat.   Eyes: Negative for photophobia and visual disturbance.  Respiratory: Negative for cough, chest tightness, shortness of breath and wheezing.   Cardiovascular: Negative for chest pain and palpitations.  Gastrointestinal: Negative for abdominal pain, blood in stool, constipation, diarrhea, nausea and vomiting.  Genitourinary: Positive for pelvic pain and vaginal bleeding. Negative for dysuria, hematuria and urgency.  Musculoskeletal: Negative for myalgias.  Skin: Negative for rash.  Neurological: Negative for dizziness, weakness and light-headedness.    Physical Exam Updated Vital Signs BP 128/68 (BP Location: Left Arm)   Pulse 72   Temp 98.4 F (36.9 C) (Oral)   Resp 16   Ht 5\' 6"  (1.676 m)   Wt 59 kg   LMP 02/19/2020   SpO2 100%   BMI 20.98 kg/m   Physical Exam Vitals and nursing note reviewed. Exam conducted with a chaperone present.  Constitutional:      General: She is not in acute distress.    Appearance: She is well-developed.  HENT:     Head: Normocephalic and atraumatic.  Nose: Nose normal.  Eyes:     General: No scleral icterus.       Right eye: No discharge.        Left eye: No discharge.     Conjunctiva/sclera: Conjunctivae normal.  Cardiovascular:     Rate and Rhythm: Normal rate and regular rhythm.     Heart sounds: Normal heart sounds. No murmur. No friction rub. No gallop.   Pulmonary:     Effort: Pulmonary effort is normal. No respiratory distress.     Breath sounds: Normal breath sounds.  Abdominal:     General: Bowel sounds are normal. There is no distension.     Palpations: Abdomen is soft.     Tenderness: There is abdominal tenderness (suprapubic). There is no guarding.  Genitourinary:    Cervix: No cervical motion tenderness.     Adnexa:        Right: No tenderness.         Left: No tenderness.       Comments: Pelvic exam: normal external genitalia  without evidence of trauma. VULVA: normal appearing vulva with no masses, tenderness or lesion. VAGINA: normal appearing vagina with normal color and discharge, no lesions. CERVIX: normal appearing cervix without lesions, cervical motion tenderness absent, cervical os closed with out purulent discharge; No vaginal discharge. Wet prep and DNA probe for chlamydia and GC obtained.   ADNEXA: normal adnexa in size, minimal tenderness; and no masses UTERUS: uterus is normal size, shape, consistency and nontender.   I was unable to visualize a tampon or any other foreign body in the vaginal canal. Musculoskeletal:        General: Normal range of motion.     Cervical back: Normal range of motion and neck supple.  Skin:    General: Skin is warm and dry.     Findings: No rash.  Neurological:     Mental Status: She is alert.     Motor: No abnormal muscle tone.     Coordination: Coordination normal.     ED Results / Procedures / Treatments   Labs (all labs ordered are listed, but only abnormal results are displayed) Labs Reviewed  WET PREP, GENITAL - Abnormal; Notable for the following components:      Result Value   WBC, Wet Prep HPF POC FEW (*)    All other components within normal limits  URINALYSIS, ROUTINE W REFLEX MICROSCOPIC - Abnormal; Notable for the following components:   APPearance CLOUDY (*)    Specific Gravity, Urine >1.030 (*)    Hgb urine dipstick LARGE (*)    All other components within normal limits  URINALYSIS, MICROSCOPIC (REFLEX) - Abnormal; Notable for the following components:   Bacteria, UA FEW (*)    All other components within normal limits  PREGNANCY, URINE  GC/CHLAMYDIA PROBE AMP ([place]) NOT AT [facility]    EKG None  Radiology US PELVIC COMPLETE W TRANSVAGINAL AND TORSION R/O  Result Date: 02/21/2020 CLINICAL DATA:  Lower abdominopelvic pain. Technologist notes state question lost tampon. EXAM: TRANSABDOMINAL AND TRANSVAGINAL ULTRASOUND OF PELVIS DOPPLER  ULTRASOUND OF OVARIES TECHNIQUE: Both transabdominal and transvaginal ultrasound examinations of the pelvis were performed. Transabdominal technique was performed for global imaging of the pelvis including uterus, ovaries, adnexal regions, and pelvic cul-de-sac. It was necessary to proceed with endovaginal exam following the transabdominal exam to visualize the ovaries and adnexa. Color and duplex Doppler ultrasound was utilized to evaluate blood flow to the ovaries. COMPARISON:  None. FINDINGS: Uterus Measurements:  6.9 x 3.0 x 4.1 cm = volume: 45 mL. The uterus is anteverted. No fibroids or other mass visualized. Endometrium Thickness: 5 mm, normal.  No focal abnormality visualized. Right ovary Measurements: 3.0 x 2.2 x 1.5 cm = volume: 5 mL. Normal appearance. Normal blood flow. No adnexal mass. Left ovary Measurements: 2.2 x 1.4 x 1.8 cm = volume: 3 mL. Normal appearance. Normal blood flow. No adnexal mass. Pulsed Doppler evaluation of both ovaries demonstrates normal low-resistance arterial and venous waveforms. Other findings No abnormal free fluid. Apparent bladder wall thickening of 8 mm is likely due to nondistention. IMPRESSION: Normal pelvic ultrasound with Doppler. No evidence of ovarian torsion. Electronically Signed   By: Narda Rutherford M.D.   On: 02/21/2020 14:36    Procedures Procedures (including critical care time)  Medications Ordered in ED Medications - No data to display  ED Course  I have reviewed the triage vital signs and the nursing notes.  Pertinent labs & imaging results that were available during my care of the patient were reviewed by me and considered in my medical decision making (see chart for details).  Clinical Course as of Feb 20 1510  Mon Feb 21, 2020  1450 Patient would like to wait for STD results before being treated.   [HK]    Clinical Course User Index [HK] Dietrich Pates, PA-C   MDM Rules/Calculators/A&P                      24 year old female  presenting to the ED for vaginal bleeding, pelvic discomfort and concern for foreign body in the vagina.  States that she had vaginal bleeding that felt similar to her prior menstrual cycles starting 2 days ago.  She did not have a menstrual cycle last month.  She was concerned that a tampon from her menstrual cycle in January 2021 was stuck in her vagina.  She is sexually active and is concerned that she could also be pregnant. She would like to be checked but not treated for STDs.  On exam patient is overall well-appearing.  There are tenderness palpation of the lower abdomen without rebound or guarding.  Pelvic exam without any foreign bodies, no cervical motion tenderness, adnexal tenderness but minimal uterine tenderness.  She is hemodynamically stable.  Pregnancy test is negative.  Urinalysis without signs of infection.  Wet prep without any concerning findings.  Pelvic ultrasound revealed no signs of torsion or other abnormalities and no foreign bodies.  She is advised that symptoms could be due to her normal menstrual cycle although irregular with her recent missed cycle.  We will have her follow-up with OB/GYN and return for worsening symptoms.  Patient is hemodynamically stable, in NAD, and able to ambulate in the ED. Evaluation does not show pathology that would require ongoing emergent intervention or inpatient treatment. I have personally reviewed and interpreted all lab work and imaging at today's ED visit. I explained the diagnosis to the patient. Pain has been managed and has no complaints prior to discharge. Patient is comfortable with above plan and is stable for discharge at this time. All questions were answered prior to disposition. Strict return precautions for returning to the ED were discussed. Encouraged follow up with PCP.   An After Visit Summary was printed and given to the patient.   Portions of this note were generated with Scientist, clinical (histocompatibility and immunogenetics). Dictation errors may occur  despite best attempts at proofreading.  Final Clinical Impression(s) / ED Diagnoses Final  diagnoses:  Lower abdominal pain  Menstrual cramps  Concern about STD in female without diagnosis    Rx / DC Orders ED Discharge Orders    None       Delia Heady, PA-C 02/21/20 [place], MD 02/21/20 1512

## 2020-02-21 NOTE — ED Triage Notes (Signed)
Pt reports vaginal bleeding, started 2 days ago , yet missed her cycle last month, adding possible stocked tampon since last cycle 2 months ago, sts possible pregnancy .

## 2020-02-21 NOTE — Discharge Instructions (Signed)
We will contact you with the results of your lab work when it is available. Take Tylenol and ibuprofen as needed for pain. Return to the ED if you start to have worsening abdominal pain, heavy bleeding, lightheadedness.

## 2020-02-22 LAB — GC/CHLAMYDIA PROBE AMP (~~LOC~~) NOT AT ARMC
Chlamydia: NEGATIVE
Neisseria Gonorrhea: NEGATIVE

## 2021-10-26 IMAGING — US US PELVIS COMPLETE TRANSABD/TRANSVAG W DUPLEX
1 series · 13 of 25 positions shown · non-contrast
Comparison: None.

CLINICAL DATA: Lower abdominopelvic pain. Technologist notes state
question lost tampon.

EXAM:
TRANSABDOMINAL AND TRANSVAGINAL ULTRASOUND OF PELVIS
DOPPLER ULTRASOUND OF OVARIES
TECHNIQUE: Both transabdominal and transvaginal ultrasound examinations of the
pelvis were performed. Transabdominal technique was performed for
global imaging of the pelvis including uterus, ovaries, adnexal
regions, and pelvic cul-de-sac.
It was necessary to proceed with endovaginal exam following the
transabdominal exam to visualize the ovaries and adnexa. Color and
duplex Doppler ultrasound was utilized to evaluate blood flow to the
ovaries.

[Series 1: us pelvis complete transabd/transvag w duplex · 13 of 58 slices shown]
[im 1/58]
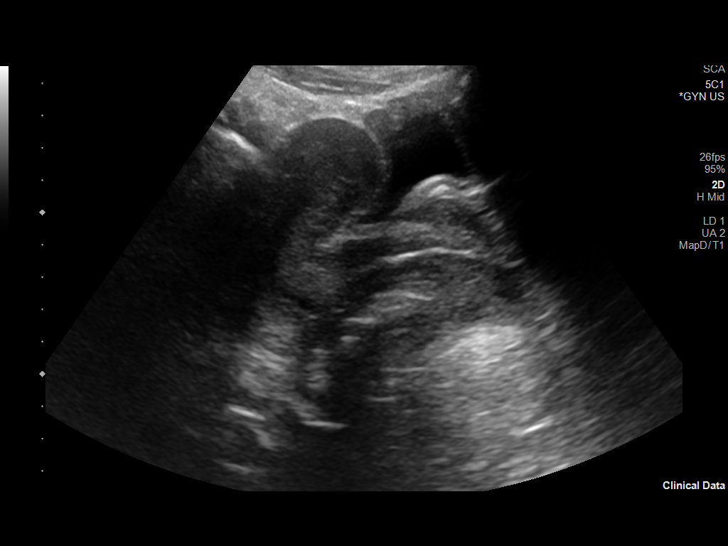
[im 5/58]
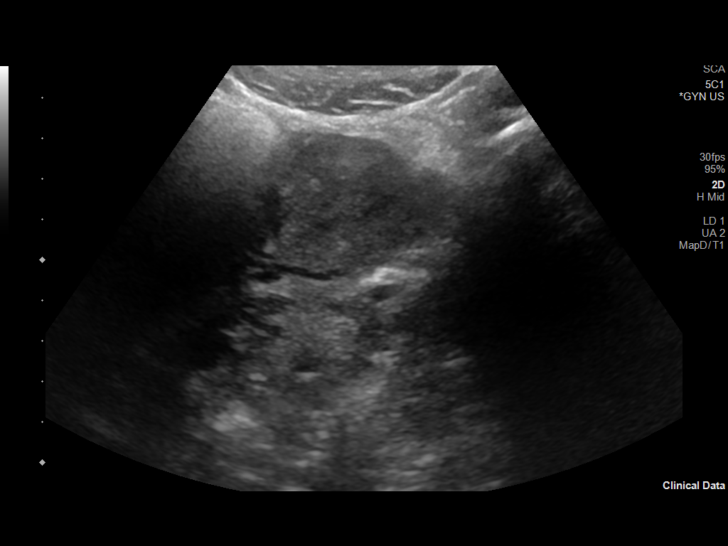
[im 10/58]
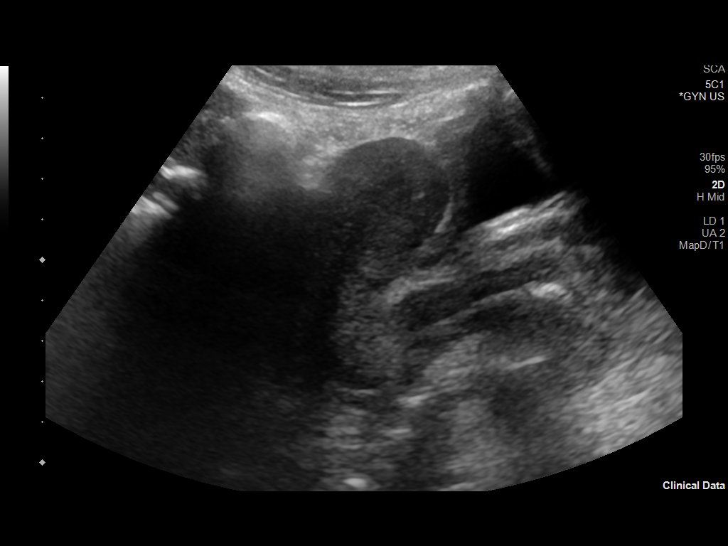
[im 15/58]
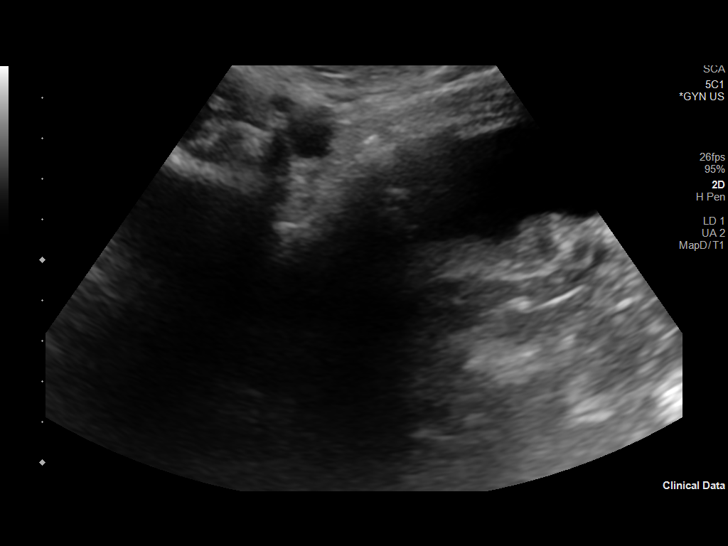
[im 20/58]
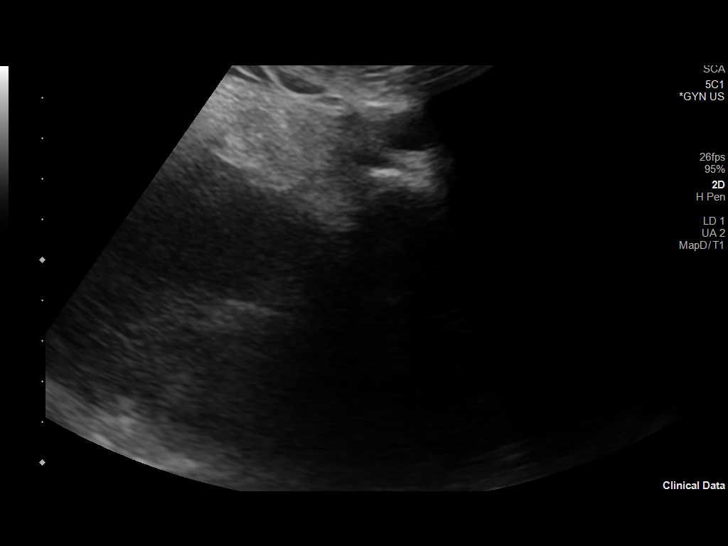
[im 24/58]
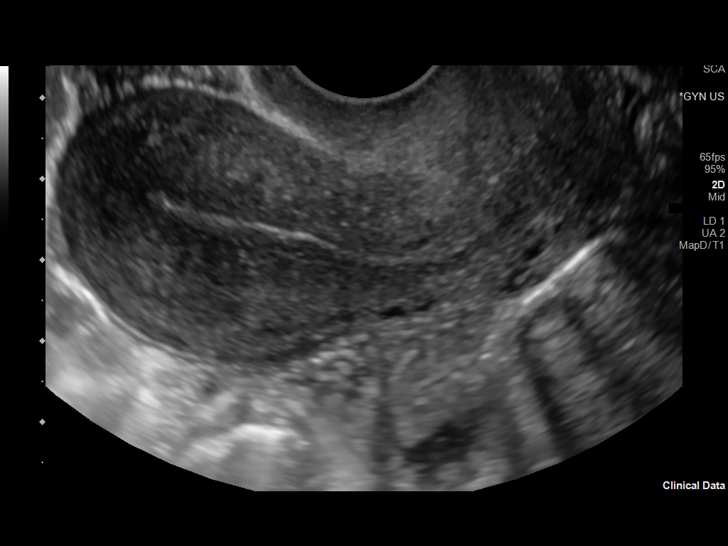
[im 29/58]
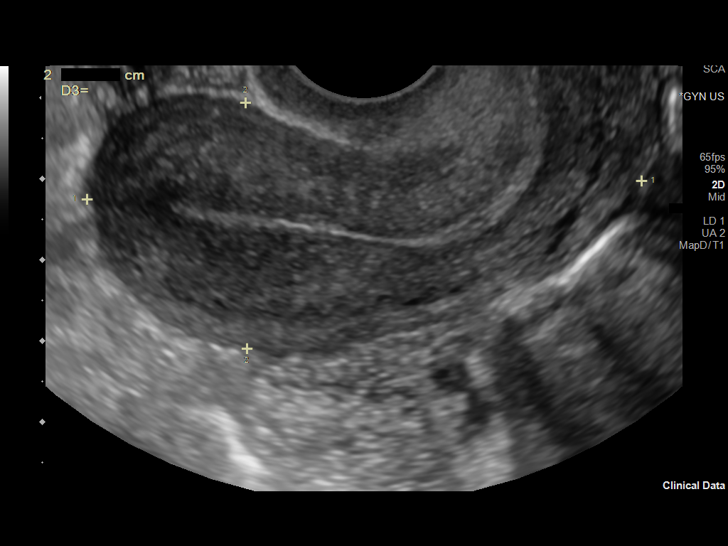
[im 34/58]
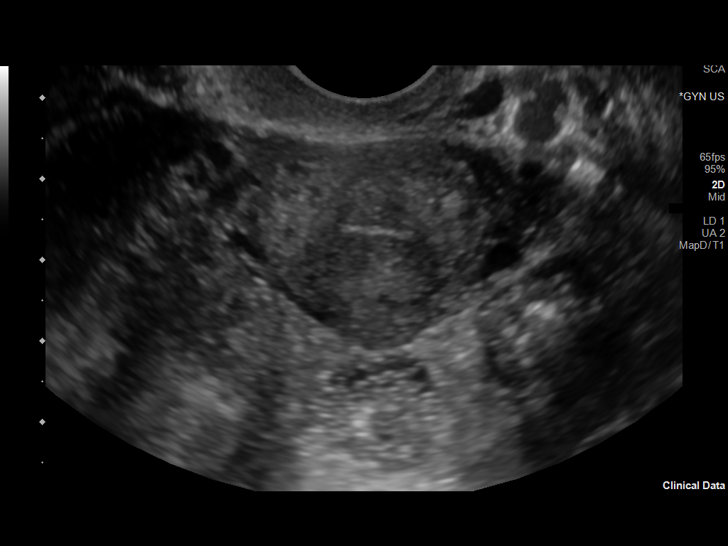
[im 39/58]
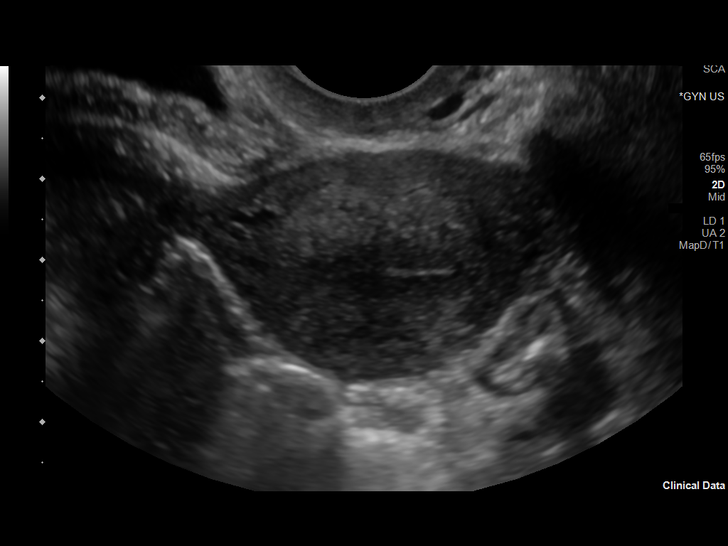
[im 43/58]
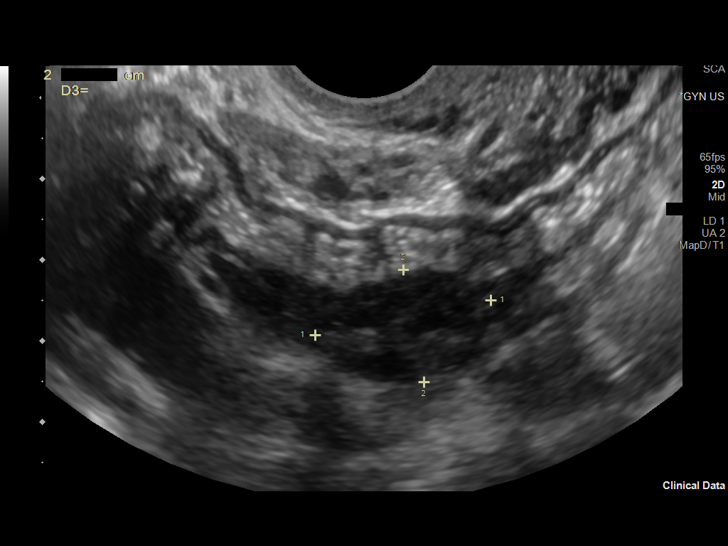
[im 48/58]
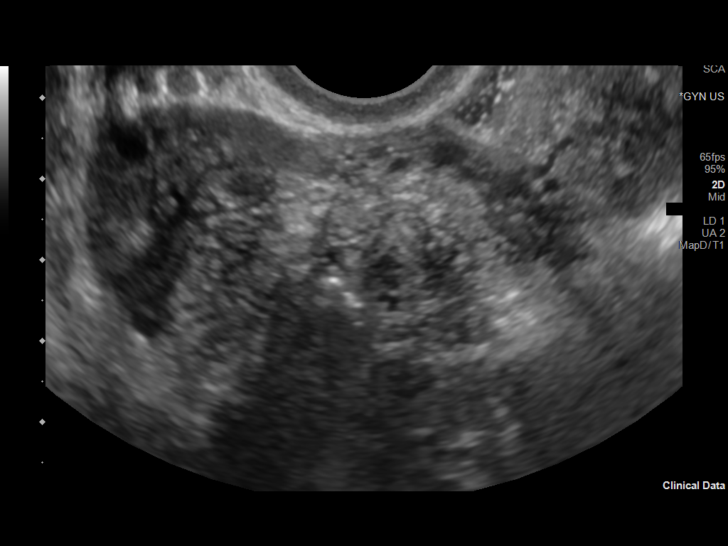
[im 53/58]
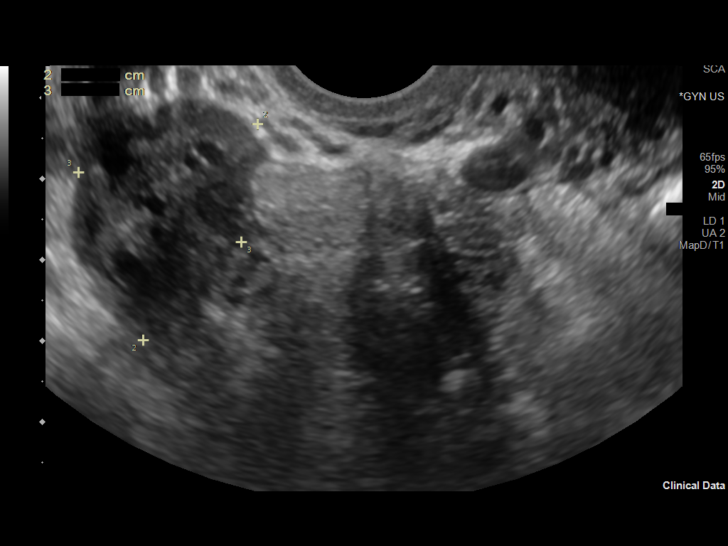
[im 58/58]
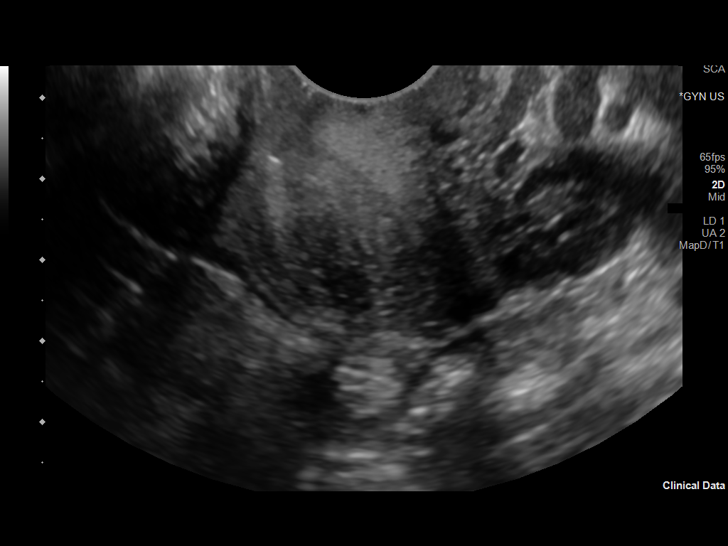

[13 of 25 positions shown; findings below may reference images not displayed]

FINDINGS: Uterus

Measurements: 6.9 x 3.0 x 4.1 cm = volume: 45 mL. The uterus is
anteverted. No fibroids or other mass visualized.

Endometrium

Thickness: 5 mm, normal.  No focal abnormality visualized.

Right ovary

Measurements: 3.0 x 2.2 x 1.5 cm = volume: 5 mL. Normal appearance.
Normal blood flow. No adnexal mass.

Left ovary

Measurements: 2.2 x 1.4 x 1.8 cm = volume: 3 mL. Normal appearance.
Normal blood flow. No adnexal mass.

Pulsed Doppler evaluation of both ovaries demonstrates normal
low-resistance arterial and venous waveforms.

Other findings

No abnormal free fluid. Apparent bladder wall thickening of 8 mm is
likely due to nondistention.
IMPRESSION: Normal pelvic ultrasound with Doppler. No evidence of ovarian
torsion.

## 2022-09-20 ENCOUNTER — Encounter (HOSPITAL_BASED_OUTPATIENT_CLINIC_OR_DEPARTMENT_OTHER): Payer: Self-pay

## 2022-09-20 ENCOUNTER — Emergency Department (HOSPITAL_BASED_OUTPATIENT_CLINIC_OR_DEPARTMENT_OTHER): Payer: Medicaid Other

## 2022-09-20 ENCOUNTER — Emergency Department (HOSPITAL_BASED_OUTPATIENT_CLINIC_OR_DEPARTMENT_OTHER)
Admission: EM | Admit: 2022-09-20 | Discharge: 2022-09-20 | Disposition: A | Discharge status: Home / Self Care | Payer: Medicaid Other | Attending: Emergency Medicine | Admitting: Emergency Medicine

## 2022-09-20 ENCOUNTER — Other Ambulatory Visit: Payer: Self-pay

## 2022-09-20 DIAGNOSIS — R519 Headache, unspecified: Secondary | ICD-10-CM | POA: Diagnosis present

## 2022-09-20 DIAGNOSIS — Z20822 Contact with and (suspected) exposure to covid-19: Secondary | ICD-10-CM | POA: Diagnosis not present

## 2022-09-20 DIAGNOSIS — E876 Hypokalemia: Secondary | ICD-10-CM | POA: Diagnosis not present

## 2022-09-20 DIAGNOSIS — N12 Tubulo-interstitial nephritis, not specified as acute or chronic: Secondary | ICD-10-CM | POA: Insufficient documentation

## 2022-09-20 LAB — COMPREHENSIVE METABOLIC PANEL
ALT: 13 U/L (ref 0–44)
AST: 19 U/L (ref 15–41)
Albumin: 4.1 g/dL (ref 3.5–5.0)
Alkaline Phosphatase: 41 U/L (ref 38–126)
Anion gap: 10 (ref 5–15)
BUN: 9 mg/dL (ref 6–20)
CO2: 20 mmol/L — ABNORMAL LOW (ref 22–32)
Calcium: 8.8 mg/dL — ABNORMAL LOW (ref 8.9–10.3)
Chloride: 101 mmol/L (ref 98–111)
Creatinine, Ser: 0.88 mg/dL (ref 0.44–1.00)
GFR, Estimated: >60 mL/min (ref >60)
Glucose, Bld: 119 mg/dL — ABNORMAL HIGH (ref 70–99)
Potassium: 2.9 mmol/L — ABNORMAL LOW (ref 3.5–5.1)
Sodium: 131 mmol/L — ABNORMAL LOW (ref 135–145)
Total Bilirubin: 0.9 mg/dL (ref 0.3–1.2)
Total Protein: 8.4 g/dL — ABNORMAL HIGH (ref 6.5–8.1)

## 2022-09-20 LAB — URINALYSIS, ROUTINE W REFLEX MICROSCOPIC
Bilirubin Urine: NEGATIVE
Glucose, UA: NEGATIVE mg/dL
Ketones, ur: 40 mg/dL — AB
Nitrite: POSITIVE — AB
Protein, ur: 100 mg/dL — AB
Specific Gravity, Urine: 1.02 (ref 1.005–1.030)
pH: 6.5 (ref 5.0–8.0)

## 2022-09-20 LAB — CBC WITH DIFFERENTIAL/PLATELET
Abs Immature Granulocytes: 0.04 10*3/uL (ref 0.00–0.07)
Basophils Absolute: 0 10*3/uL (ref 0.0–0.1)
Basophils Relative: 0 %
Eosinophils Absolute: 0.7 10*3/uL — ABNORMAL HIGH (ref 0.0–0.5)
Eosinophils Relative: 5 %
HCT: 36.7 % (ref 36.0–46.0)
Hemoglobin: 12.6 g/dL (ref 12.0–15.0)
Immature Granulocytes: 0 %
Lymphocytes Relative: 8 %
Lymphs Abs: 1.1 10*3/uL (ref 0.7–4.0)
MCH: 33.3 pg (ref 26.0–34.0)
MCHC: 34.3 g/dL (ref 30.0–36.0)
MCV: 97.1 fL (ref 80.0–100.0)
Monocytes Absolute: 1.2 10*3/uL — ABNORMAL HIGH (ref 0.1–1.0)
Monocytes Relative: 9 %
Neutro Abs: 10.3 10*3/uL — ABNORMAL HIGH (ref 1.7–7.7)
Neutrophils Relative %: 78 %
Platelets: 246 10*3/uL (ref 150–400)
RBC: 3.78 MIL/uL — ABNORMAL LOW (ref 3.87–5.11)
RDW: 12.8 % (ref 11.5–15.5)
Smear Review: NORMAL
WBC: 13.4 10*3/uL — ABNORMAL HIGH (ref 4.0–10.5)
nRBC: 0 % (ref 0.0–0.2)

## 2022-09-20 LAB — URINALYSIS, MICROSCOPIC (REFLEX)

## 2022-09-20 LAB — PREGNANCY, URINE: Preg Test, Ur: NEGATIVE

## 2022-09-20 LAB — SARS CORONAVIRUS 2 BY RT PCR: SARS Coronavirus 2 by RT PCR: NEGATIVE

## 2022-09-20 MED ORDER — SODIUM CHLORIDE 0.9 % IV BOLUS
1000.0000 mL | Freq: Once | INTRAVENOUS | Status: AC
  Administered 2022-09-20: 1000 mL via INTRAVENOUS

## 2022-09-20 MED ORDER — SODIUM CHLORIDE 0.9 % IV SOLN: INTRAVENOUS | Status: DC | PRN

## 2022-09-20 MED ORDER — POLYETHYLENE GLYCOL 3350 17 G PO PACK
17.0000 g | PACK | Freq: Every day | ORAL | Status: DC
  Administered 2022-09-20: 17 g via ORAL
  Filled 2022-09-20: qty 1

## 2022-09-20 MED ORDER — ONDANSETRON HCL 4 MG PO TABS: 4.0000 mg | ORAL_TABLET | Freq: Four times a day (QID) | ORAL | 0 refills | Status: AC

## 2022-09-20 MED ORDER — KETOROLAC TROMETHAMINE 15 MG/ML IJ SOLN
15.0000 mg | Freq: Once | INTRAMUSCULAR | Status: AC
  Administered 2022-09-20: 15 mg via INTRAVENOUS
  Filled 2022-09-20: qty 1

## 2022-09-20 MED ORDER — POTASSIUM CHLORIDE CRYS ER 20 MEQ PO TBCR: 40.0000 meq | EXTENDED_RELEASE_TABLET | Freq: Every day | ORAL | 0 refills | Status: AC

## 2022-09-20 MED ORDER — CEFPODOXIME PROXETIL 200 MG PO TABS: 200.0000 mg | ORAL_TABLET | Freq: Two times a day (BID) | ORAL | 0 refills | Status: AC

## 2022-09-20 MED ORDER — DIPHENHYDRAMINE HCL 50 MG/ML IJ SOLN
25.0000 mg | Freq: Once | INTRAMUSCULAR | Status: AC
  Administered 2022-09-20: 25 mg via INTRAVENOUS
  Filled 2022-09-20: qty 1

## 2022-09-20 MED ORDER — ACETAMINOPHEN 500 MG PO TABS
1000.0000 mg | ORAL_TABLET | Freq: Once | ORAL | Status: AC
  Administered 2022-09-20: 1000 mg via ORAL
  Filled 2022-09-20: qty 2

## 2022-09-20 MED ORDER — POTASSIUM CHLORIDE 10 MEQ/100ML IV SOLN
10.0000 meq | INTRAVENOUS | Status: AC
  Administered 2022-09-20 (×2): 10 meq via INTRAVENOUS
  Filled 2022-09-20 (×2): qty 100

## 2022-09-20 MED ORDER — METOCLOPRAMIDE HCL 5 MG/ML IJ SOLN
10.0000 mg | Freq: Once | INTRAMUSCULAR | Status: AC
  Administered 2022-09-20: 10 mg via INTRAVENOUS
  Filled 2022-09-20: qty 2

## 2022-09-20 MED ORDER — POTASSIUM CHLORIDE CRYS ER 20 MEQ PO TBCR
40.0000 meq | EXTENDED_RELEASE_TABLET | Freq: Once | ORAL | Status: AC
  Administered 2022-09-20: 40 meq via ORAL
  Filled 2022-09-20: qty 2

## 2022-09-20 MED ORDER — SODIUM CHLORIDE 0.9 % IV SOLN
1.0000 g | Freq: Once | INTRAVENOUS | Status: AC
  Administered 2022-09-20: 1 g via INTRAVENOUS
  Filled 2022-09-20: qty 10

## 2022-09-20 NOTE — Discharge Instructions (Addendum)
Please follow-up with your primary care doctor to repeat check your potassium in about 1 week to make sure that it is at a normal level.  Also please return to the ER if your fever, chills, pain is not improving after the next 48 hours.  If you are actively getting worse please return to the ER.  You should feel like your symptoms are improving such as your fever, back pain, and urinary frequency within the next 48 hours.  Take Tylenol at home for the fever.  Ibuprofen for pain control.

## 2022-09-20 NOTE — ED Notes (Signed)
Discharge instructions were discussed with pt. Pt verbalized understanding with no questions at this time.  

## 2022-09-20 NOTE — ED Triage Notes (Signed)
Reports migraine x 3 days.  Hx of migraines but this one will not go away.  Also reports eye pain with movement.  Patient having nausea.  Reports 1st BM today in 5 days.

## 2022-09-20 NOTE — ED Notes (Signed)
Pt placed back on cardiac monitor. Informed importance of monitoring heart rate while infusing IV K. Son at bedside.

## 2022-09-20 NOTE — ED Provider Notes (Signed)
[facility] EMERGENCY DEPARTMENT Provider Note   CSN: [id number] Arrival date & time: 09/20/22  1512     History  Chief Complaint  Patient presents with   Migraine    Leah Knapp is a 26 y.o. female, no pertinent past medical history, who presents to the ED secondary to fatigue for 5 days, headache for 3 days, and no BM for last 5 days.  She states that for the last 5 days she is just felt very tired and unwell, has been more fatigued, and unable to have a bowel movement.  Endorses good p.o. intake until about 2 days ago.  Denies any vomiting, has had some nausea.  Is able to pass gas, but has not been passing a lot of gas.  States abdominal pain is just generalized, she feels full.  Has taken Dulcolax without any relief.  Has a history of constipation, states this happens quite frequently.  Additionally here for headache that is described as in front of the eyes, associated with tenderness to touch, light sensitivity, and a little nausea.  That has not had any vision changes, neck stiffness, but does endorse having a fever.  Did not even know she had a fever until today.  But has been waking up in the middle the night with wet bedsheets from sweating.  Denies any sick contacts, but however does have a child who is in daycare.     Home Medications Prior to Admission medications   Medication Sig Start Date End Date Taking? Authorizing Provider  cefpodoxime (VANTIN) 200 MG tablet Take 1 tablet (200 mg total) by mouth 2 (two) times daily. 09/20/22  Yes Timiya Howells L, PA  ondansetron (ZOFRAN) 4 MG tablet Take 1 tablet (4 mg total) by mouth every 6 (six) hours. 09/20/22  Yes Kaevion Sinclair L, PA  potassium chloride SA (KLOR-CON M) 20 MEQ tablet Take 2 tablets (40 mEq total) by mouth daily. 09/20/22  Yes Cliffton Spradley L, PA  amoxicillin-clavulanate (AUGMENTIN) 875-125 MG tablet Take 1 tablet by mouth every 12 (twelve) hours. 11/28/17   Tilden Fossa, MD      Allergies     Patient has no known allergies.    Review of Systems   Review of Systems  Constitutional:  Positive for chills, fatigue and fever.  HENT:  Negative for ear pain, sinus pain and sore throat.   Respiratory:  Negative for shortness of breath.   Neurological:  Positive for headaches. Negative for dizziness.    Physical Exam Updated Vital Signs BP 130/68 (BP Location: Right Arm)   Pulse 90   Temp 98.1 F (36.7 C) (Oral)   Resp 18   Ht 5\' 6"  (1.676 m)   Wt 59 kg   LMP  (Approximate)   SpO2 98%   BMI 20.98 kg/m  Physical Exam Constitutional:      Appearance: Normal appearance.  HENT:     Head: Normocephalic and atraumatic.     Right Ear: Tympanic membrane normal.     Left Ear: Tympanic membrane normal.     Nose: Nose normal.     Mouth/Throat:     Mouth: Mucous membranes are dry.  Eyes:     Extraocular Movements: Extraocular movements intact.     Conjunctiva/sclera: Conjunctivae normal.     Pupils: Pupils are equal, round, and reactive to light.  Cardiovascular:     Rate and Rhythm: Normal rate and regular rhythm.     Pulses: Normal pulses.  Pulmonary:  Effort: Pulmonary effort is normal.     Breath sounds: Examination of the left-upper field reveals rhonchi. Examination of the left-lower field reveals rhonchi. Rhonchi present.  Abdominal:     Palpations: Abdomen is soft.     Tenderness: There is generalized abdominal tenderness. There is left CVA tenderness. There is no right CVA tenderness, guarding or rebound.     Comments: +bloated  Musculoskeletal:        General: Normal range of motion.     Cervical back: Normal range of motion and neck supple. No rigidity or tenderness.  Skin:    General: Skin is warm and dry.     Capillary Refill: Capillary refill takes less than 2 seconds.  Neurological:     General: No focal deficit present.     Mental Status: She is alert and oriented to person, place, and time.     ED Results / Procedures / Treatments   Labs (all  labs ordered are listed, but only abnormal results are displayed) Labs Reviewed  URINALYSIS, ROUTINE W REFLEX MICROSCOPIC - Abnormal; Notable for the following components:      Result Value   APPearance CLOUDY (*)    Hgb urine dipstick MODERATE (*)    Ketones, ur 40 (*)    Protein, ur 100 (*)    Nitrite POSITIVE (*)    Leukocytes,Ua TRACE (*)    All other components within normal limits  URINALYSIS, MICROSCOPIC (REFLEX) - Abnormal; Notable for the following components:   Bacteria, UA MANY (*)    All other components within normal limits  COMPREHENSIVE METABOLIC PANEL - Abnormal; Notable for the following components:   Sodium 131 (*)    Potassium 2.9 (*)    CO2 20 (*)    Glucose, Bld 119 (*)    Calcium 8.8 (*)    Total Protein 8.4 (*)    All other components within normal limits  CBC WITH DIFFERENTIAL/PLATELET - Abnormal; Notable for the following components:   WBC 13.4 (*)    RBC 3.78 (*)    Neutro Abs 10.3 (*)    Monocytes Absolute 1.2 (*)    Eosinophils Absolute 0.7 (*)    All other components within normal limits  SARS CORONAVIRUS 2 BY RT PCR  URINE CULTURE  PREGNANCY, URINE    EKG None  Radiology DG Chest 2 View  Result Date: 09/20/2022 CLINICAL DATA:  Cough today. EXAM: CHEST - 2 VIEW COMPARISON:  05/10/2015 FINDINGS: The heart size and mediastinal contours are within normal limits. Both lungs are clear. The visualized skeletal structures are unremarkable. IMPRESSION: No active cardiopulmonary disease. Electronically Signed   By: Beckie Salts M.D.   On: 09/20/2022 16:39   DG Abdomen 1 View  Result Date: 09/20/2022 CLINICAL DATA:  Abdominal pain. No bowel movement for the past 5 days. EXAM: ABDOMEN - 1 VIEW COMPARISON:  09/03/2015 FINDINGS: Normal bowel gas pattern. Normal amount of stool. Unremarkable bones. IMPRESSION: Normal examination. Electronically Signed   By: Beckie Salts M.D.   On: 09/20/2022 16:37    Procedures Procedures    Medications Ordered in  ED Medications  polyethylene glycol (MIRALAX / GLYCOLAX) packet 17 g (17 g Oral Given 09/20/22 1753)  0.9 %  sodium chloride infusion (has no administration in time range)  acetaminophen (TYLENOL) tablet 1,000 mg (1,000 mg Oral Given 09/20/22 1615)  ketorolac (TORADOL) 15 MG/ML injection 15 mg (15 mg Intravenous Given 09/20/22 1645)  sodium chloride 0.9 % bolus 1,000 mL (0 mLs Intravenous Stopped 09/20/22  1749)  metoCLOPramide (REGLAN) injection 10 mg (10 mg Intravenous Given 09/20/22 1646)  diphenhydrAMINE (BENADRYL) injection 25 mg (25 mg Intravenous Given 09/20/22 1644)  cefTRIAXone (ROCEPHIN) 1 g in sodium chloride 0.9 % 100 mL IVPB (0 g Intravenous Stopped 09/20/22 1749)  potassium chloride SA (KLOR-CON M) CR tablet 40 mEq (40 mEq Oral Given 09/20/22 1750)  potassium chloride 10 mEq in 100 mL IVPB (10 mEq Intravenous New Bag/Given 09/20/22 1853)    ED Course/ Medical Decision Making/ A&P                           Medical Decision Making Amount and/or Complexity of Data Reviewed Labs: ordered. Radiology: ordered.  Risk OTC drugs. Prescription drug management.   Patient is a 26 year old female, no pertinent past medical history, here for abdominal discomfort, and headache.  She was given Reglan, Benadryl, Toradol with relief of her headache.  She did not have any nuchal rigidity, neck pain, or any neurodeficits.  She is found to be febrile however when she was in the ER, and found to have a nitrate positive UA.  She did also have some left CVA tenderness concerning for pyelonephritis.  She was febrile, and mildly tachycardic when she was in the ER initially, but this improved with fluids, and Toradol.  She was offered a CT abdomen pelvis to rule out any kind of other abnormality, she declined this.  She was given strict return precautions for return.  She was also found to be hypokalemic, in the ER and given 20 mEq IV Potassium, and then 40 mEq potassium chloride p.o.  She was  discharged with potassium chloride for replenishment, instructed follow-up with her primary care doctor.  She is also given 1 dose of ceftriaxone in the ER, and discharged with cefpodoxime for her pyelonephritis.  She is instructed follow-up with her PCP to ensure that things were improving.  She was well-appearing on my exam and did not appear septic.  Final Clinical Impression(s) / ED Diagnoses Final diagnoses:  Pyelonephritis  Acute nonintractable headache, unspecified headache type  Hypokalemia    Rx / DC Orders ED Discharge Orders          Ordered    potassium chloride SA (KLOR-CON M) 20 MEQ tablet  Daily        09/20/22 1952    cefpodoxime (VANTIN) 200 MG tablet  2 times daily        09/20/22 1952    ondansetron (ZOFRAN) 4 MG tablet  Every 6 hours        09/20/22 1952              Quantina Dershem, [place], [place] 09/20/22 1956    Sherwood Gambler, MD 09/21/22 1617

## 2022-09-23 LAB — URINE CULTURE: Culture: >=100000 — AB

## 2022-09-24 ENCOUNTER — Telehealth (HOSPITAL_BASED_OUTPATIENT_CLINIC_OR_DEPARTMENT_OTHER): Payer: Self-pay | Admitting: *Deleted

## 2022-09-24 NOTE — Telephone Encounter (Signed)
Post ED Visit - Positive Culture Follow-up  Culture report reviewed by antimicrobial stewardship pharmacist: [place] Team []  Elenor Quinones, Pharm.D. []  Heide Guile, Pharm.D., BCPS AQ-ID []  Parks Neptune, Pharm.D., BCPS []  Alycia Rossetti, Pharm.D., BCPS []  Sloan, Pharm.D., BCPS, AAHIVP []  Legrand Como, Pharm.D., BCPS, AAHIVP []  Salome Arnt, PharmD, BCPS []  Johnnette Gourd, PharmD, BCPS []  Hughes Better, PharmD, BCPS []  Leeroy Cha, PharmD []  Laqueta Linden, PharmD, BCPS []  Albertina Parr, PharmD  [place] Team []  Leodis Sias, PharmD []  Lindell Spar, PharmD []  Royetta Asal, PharmD []  Graylin Shiver, Rph []  Rema Fendt) Glennon Mac, PharmD []  Arlyn Dunning, PharmD []  Netta Cedars, PharmD []  Dia Sitter, PharmD []  Leone Haven, PharmD []  Gretta Arab, PharmD []  Theodis Shove, PharmD []  Peggyann Juba, PharmD []  Reuel Boom, PharmD   Positive urine culture Treated with Cefpodoxime Proxetil, organism sensitive to the same and no further patient follow-up is required at this time.  Louanne Belton, PharmD  Harlon Flor Talley 09/24/2022, 10:22 AM

## 2023-12-08 ENCOUNTER — Emergency Department (HOSPITAL_BASED_OUTPATIENT_CLINIC_OR_DEPARTMENT_OTHER)
Admission: EM | Admit: 2023-12-08 | Discharge: 2023-12-08 | Disposition: A | Discharge status: Home / Self Care | Payer: Medicaid Other | Attending: Emergency Medicine | Admitting: Emergency Medicine

## 2023-12-08 ENCOUNTER — Other Ambulatory Visit: Payer: Self-pay

## 2023-12-08 DIAGNOSIS — Z20822 Contact with and (suspected) exposure to covid-19: Secondary | ICD-10-CM | POA: Insufficient documentation

## 2023-12-08 DIAGNOSIS — J09X2 Influenza due to identified novel influenza A virus with other respiratory manifestations: Secondary | ICD-10-CM | POA: Insufficient documentation

## 2023-12-08 DIAGNOSIS — J111 Influenza due to unidentified influenza virus with other respiratory manifestations: Secondary | ICD-10-CM

## 2023-12-08 DIAGNOSIS — R059 Cough, unspecified: Secondary | ICD-10-CM | POA: Diagnosis present

## 2023-12-08 LAB — RESP PANEL BY RT-PCR (RSV, FLU A&B, COVID)  RVPGX2
Influenza A by PCR: POSITIVE — AB
Influenza B by PCR: NEGATIVE
Resp Syncytial Virus by PCR: NEGATIVE
SARS Coronavirus 2 by RT PCR: NEGATIVE

## 2023-12-08 LAB — PREGNANCY, URINE: Preg Test, Ur: NEGATIVE

## 2023-12-08 MED ORDER — IBUPROFEN 800 MG PO TABS
800.0000 mg | ORAL_TABLET | Freq: Once | ORAL | Status: AC
  Administered 2023-12-08: 800 mg via ORAL
  Filled 2023-12-08: qty 1

## 2023-12-08 MED ORDER — ONDANSETRON 4 MG PO TBDP: 4.0000 mg | ORAL_TABLET | Freq: Three times a day (TID) | ORAL | 0 refills | Status: AC | PRN

## 2023-12-08 MED ORDER — ACETAMINOPHEN 500 MG PO TABS
1000.0000 mg | ORAL_TABLET | Freq: Once | ORAL | Status: AC
  Administered 2023-12-08: 1000 mg via ORAL
  Filled 2023-12-08: qty 2

## 2023-12-08 MED ORDER — BENZONATATE 100 MG PO CAPS: 100.0000 mg | ORAL_CAPSULE | Freq: Three times a day (TID) | ORAL | 0 refills | Status: AC

## 2023-12-08 MED ORDER — ONDANSETRON 4 MG PO TBDP
4.0000 mg | ORAL_TABLET | Freq: Once | ORAL | Status: AC
  Administered 2023-12-08: 4 mg via ORAL
  Filled 2023-12-08: qty 1

## 2023-12-08 NOTE — ED Triage Notes (Signed)
Pt arrives with c/o cough, congestion, bodyaches, n/v, and headache that started 4 days ago. Pt reports fevers and decreased PO intake. Pts child also has same symptoms.

## 2023-12-08 NOTE — Discharge Instructions (Signed)
Your flu test was positive.  This is likely the cause of your symptoms  I would recommend routine Tylenol and ibuprofen at home  I have written you for some nausea medication, Zofran  I have also written for cough medicine, Tessalon Perles  Gradual increase diet at home  Follow-up outpatient, return for any worsening symptoms

## 2023-12-08 NOTE — ED Provider Notes (Cosign Needed)
[place] [facility]ERGENCY DEPARTMENT AT [facility] Provider Note   CSN: [id number] Arrival date & time: 12/08/23  1632     History  Chief Complaint  Patient presents with   Flu-Like Symptoms    Leah Knapp is a 27 y.o. female here for evaluation of flulike symptoms.  4 days of cough, congestion, headache, myalgias, fatigue, subjective fevers.  Has had a few episodes of NBNB emesis today.  No abdominal pain.  Not taking any medication at home.  States she has no appetite.  Associated runny nose and sinus pressure.  Cough is nonproductive.  Denies chance of pregnancy.  Son here with similar symptoms, multiple sick members at home.  HPI     Home Medications Prior to Admission medications   Medication Sig Start Date End Date Taking? Authorizing Provider  benzonatate (TESSALON) 100 MG capsule Take 1 capsule (100 mg total) by mouth every 8 (eight) hours. 12/08/23  Yes Eastyn Dattilo A, PA-C  ondansetron (ZOFRAN-ODT) 4 MG disintegrating tablet Take 1 tablet (4 mg total) by mouth every 8 (eight) hours as needed. 12/08/23  Yes Mainor Hellmann A, PA-C  amoxicillin-clavulanate (AUGMENTIN) 875-125 MG tablet Take 1 tablet by mouth every 12 (twelve) hours. 11/28/17   Tilden Fossa, MD  cefpodoxime (VANTIN) 200 MG tablet Take 1 tablet (200 mg total) by mouth 2 (two) times daily. 09/20/22   Small, Brooke L, PA  ondansetron (ZOFRAN) 4 MG tablet Take 1 tablet (4 mg total) by mouth every 6 (six) hours. 09/20/22   Small, Brooke L, PA  potassium chloride SA (KLOR-CON M) 20 MEQ tablet Take 2 tablets (40 mEq total) by mouth daily. 09/20/22   Small, Brooke L, PA      Allergies    Patient has no known allergies.    Review of Systems   Review of Systems  Constitutional:  Positive for activity change, appetite change, chills, fatigue and fever.  HENT:  Positive for congestion, rhinorrhea, sinus pressure and sinus pain. Negative for sore throat, tinnitus, trouble swallowing and voice  change.   Respiratory:  Positive for cough. Negative for apnea, choking, chest tightness, shortness of breath, wheezing and stridor.   Cardiovascular: Negative.   Gastrointestinal:  Positive for nausea and vomiting. Negative for abdominal distention, abdominal pain, anal bleeding, blood in stool, constipation, diarrhea and rectal pain.  Genitourinary: Negative.   Musculoskeletal:  Positive for myalgias.  Skin: Negative.   Neurological: Negative.   All other systems reviewed and are negative.   Physical Exam Updated Vital Signs BP 112/69   Pulse 99   Temp 99.3 F (37.4 C) (Oral)   Resp 14   Wt 59 kg   SpO2 100%   BMI 20.98 kg/m  Physical Exam Vitals and nursing note reviewed.  Constitutional:      General: She is not in acute distress.    Appearance: She is well-developed. She is not ill-appearing, toxic-appearing or diaphoretic.  HENT:     Head: Normocephalic and atraumatic.     Nose: Congestion and rhinorrhea present.     Mouth/Throat:     Mouth: Mucous membranes are moist.     Comments: Po clear, no pooling of secretions. Eyes:     Pupils: Pupils are equal, round, and reactive to light.  Cardiovascular:     Rate and Rhythm: Normal rate.  Pulmonary:     Effort: No respiratory distress.  Chest:     Comments: Non tender, no crepitus Abdominal:     General: There is no distension.  Musculoskeletal:        General: Normal range of motion.     Cervical back: Normal range of motion.     Comments: Full rom without difficulty spontaneously   Skin:    General: Skin is warm and dry.     Comments: No obvious rashes or lesions to exposed skin  Neurological:     General: No focal deficit present.     Mental Status: She is alert.  Psychiatric:        Mood and Affect: Mood normal.    ED Results / Procedures / Treatments   Labs (all labs ordered are listed, but only abnormal results are displayed) Labs Reviewed  RESP PANEL BY RT-PCR (RSV, FLU A&B, COVID)  RVPGX2 -  Abnormal; Notable for the following components:      Result Value   Influenza A by PCR POSITIVE (*)    All other components within normal limits  PREGNANCY, URINE    EKG None  Radiology No results found.  Procedures Procedures    Medications Ordered in ED Medications  ondansetron (ZOFRAN-ODT) disintegrating tablet 4 mg (4 mg Oral Given 12/08/23 1659)  acetaminophen (TYLENOL) tablet 1,000 mg (1,000 mg Oral Given 12/08/23 1659)  ibuprofen (ADVIL) tablet 800 mg (800 mg Oral Given 12/08/23 2140)    ED Course/ Medical Decision Making/ A&P   27 year old here for URI sx. No meds at home. Appears clinically well hydrated. Heart and lungs clear. Abd soft non tender. PO clear, no pooling of secretions. No neck stiffness or neck rigidity, no meningismus.  Labs personally viewed and interpreted:  Preg neg Flu positive  She is outside Tamiflu window. Will write for sx management, Tolerating PO intake in ED prior to dc. Low suspicion for sepsis, bacterial infectious process, PE, PNA, pneumothorax, pneumomediastinum.   The patient has been appropriately medically screened and/or stabilized in the ED. I have low suspicion for any other emergent medical condition which would require further screening, evaluation or treatment in the ED or require inpatient management.  Patient is hemodynamically stable and in no acute distress.  Patient able to ambulate in department prior to ED.  Evaluation does not show acute pathology that would require ongoing or additional emergent interventions while in the emergency department or further inpatient treatment.  I have discussed the diagnosis with the patient and answered all questions.  Pain is been managed while in the emergency department and patient has no further complaints prior to discharge.  Patient is comfortable with plan discussed in room and is stable for discharge at this time.  I have discussed strict return precautions for returning to the  emergency department.  Patient was encouraged to follow-up with PCP/specialist refer to at discharge.                                 Medical Decision Making Amount and/or Complexity of Data Reviewed External Data Reviewed: labs, radiology and notes. Labs: ordered. Decision-making details documented in ED Course.  Risk OTC drugs. Prescription drug management. Decision regarding hospitalization. Diagnosis or treatment significantly limited by social determinants of health.           Final Clinical Impression(s) / ED Diagnoses Final diagnoses:  Influenza    Rx / DC Orders ED Discharge Orders          Ordered    ondansetron (ZOFRAN-ODT) 4 MG disintegrating tablet  Every 8 hours PRN  12/08/23 2120    benzonatate (TESSALON) 100 MG capsule  Every 8 hours        12/08/23 2120              Jawaun Celmer A, PA-C 12/08/23 2333

## 2024-07-12 NOTE — Progress Notes (Signed)
 CC: Colposcopy  Subjective:   HPI: Leah Knapp is a 28 y.o. y/o [id number] who presents today for Colposcopy.  Patient's last menstrual period was 05/19/2024 (approximate)..    04/12/2024 ASCUS HPV+  Pt also states HSV out break last week states the out break is no longer there did have valtrex  to take. Wondering if there is anything that could trigger outbreak  Also states unprotected intercourse on 07/10/2024, with gap in her [facility], states she ran out about 3 days ago. LMP was 05/19/2024  Also having some burning with urination.      Past medical, surgical, social and family histories reviewed and updated.   ______________________________________________________________________  Surgical History[1]  Allergies[2]  Medical History[3]  OB History  Gravida Para Term Preterm AB Living  5 1 1  4    SAB IAB Ectopic Molar Multiple Live Births   4        # Outcome Date GA Lbr Len/2nd Weight Sex Type Anes PTL Lv  5 Term 2018          4 IAB      Medical Trea     3 IAB      Medical Trea     2 IAB      Medical Trea     1 IAB      Medical Trea         Family History[4]  Social History   Socioeconomic History  . Marital status: Single    Spouse name: Not on file  . Number of children: Not on file  . Years of education: Not on file  . Highest education level: Not on file  Occupational History  . Not on file  Tobacco Use  . Smoking status: Former    Types: Cigarettes  . Smokeless tobacco: Never  Substance and Sexual Activity  . Alcohol use: Not Currently    Comment: quit 01/2024  . Drug use: Not Currently    Types: Cocaine    Comment: quit - 01/2024  . Sexual activity: Yes    Birth control/protection: OCP  Other Topics Concern  . Not on file  Social History Narrative  . Not on file   Social Drivers of Health   Food Insecurity: Not on file  Transportation Needs: Not on file  Safety: Not on file  Living Situation: Not on file    Current  Medications[5]   Review Of Systems negative except as above.  Objective:   PHYSICAL EXAM: Ht 1.676 m (5' 6)   Wt 85.3 kg (188 lb)   LMP 05/19/2024 (Approximate)   BMI 30.34 kg/m  Constitutional: Well-developed, well-nourished female in no acute distress Neurological: Alert and oriented to person, place, and time Psychiatric: Mood and affect appropriate Skin: Warm and dry, no rashes or lesions Respiratory: Normal work of breathing. Cardiovascular: Warm and well perfused Gastrointestinal: Soft, nontender, nondistended. No masses or hernias appreciated. Genitourinary:         External Genitalia: Normal female genitalia w/o masses, lesions, rashes ++ healing HSV lesions labia minora    Urethral Meatus: Normal caliber and position    Urethra: Midline, no masses    Bladder: Well-suspended, NT    Vagina: mucosa pink and moist without lesions or ulcers     Cervix: No lesions, normal size and consistency; no cervical motion tenderness     Uterus: Normal size and contour; smooth, mobile, NT Adnexa/Parametria: No masses; no parametrial nodularity; no tenderness Perineum/Anus: No lesions, no hemorids   Chaperone present during  exam.    Assessment/Plan:  Leah Knapp is a 28 y.o. 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  It was created on their behalf by Richerd Panda, CMA, a trained medical scribe, and Certified Medical Assistant  (CMA). During the course of documenting the history, physical exam and medical decision making, I was functioning as a Stage manager. The creation of this record is the provider's dictation and/or activities during the visit.  Electronically signed by Richerd Panda, CMA 07/12/2024 11:17 AM  MDM moderate 1+ chronic medical condition with exacerbation (HSV) + prescription management (valtrex , Micronor)    Richerd Panda, CMA  I agree the documentation is accurate and complete.  Electronically signed by: Lavanda Rumalda Glaze, MD 07/17/2024 5:58 PM        [1] Past Surgical History: Procedure Laterality Date  . SOFT TISSUE BIOPSY Left 03/24/2024   EXCISION OF LEFT BACK MASS performed by Prentice Tanda Pizza, MD at [facility] OR  [2] No Known Allergies [3] Past Medical History: Diagnosis Date  . Acquired hypothyroidism   . Chronic idiopathic constipation   . Chronic recurrent bacterial vaginitis   . Drug abuse (CMD)   . Elevated blood pressure reading in office without diagnosis of hypertension   . Herpesviral infection of urogenital system   . Hypercholesterolemia   . Migraine with aura   . Opioid overdose    (CMD)   . Pyelonephritis   . Vitamin D deficiency   [4] Family History Problem Relation Name Age of Onset  . Breast cancer Neg Hx    . Ovarian cancer Neg Hx    . Colon cancer Neg Hx    . Cancer Neg Hx    [5]  Current Outpatient Medications:  .  norethindrone 0.35 mg tab, Take 1 tablet (0.35 mg total) by mouth daily., Disp: 90 tablet, Rfl: 3 .  diazePAM (Valium) 5 mg tablet, Take 1 tablet 30 minutes prior to procedure. Bring second tablet to appointment. Do not drive after taking medication (Patient not taking: Reported on 07/12/2024), Disp: 2 tablet, Rfl: 0 .  HYDROcodone -acetaminophen  (NORCO) 5-325 mg per tablet, Take 1 tablet by mouth every 6 (six) hours as needed for moderate pain (4-6). (Patient not taking: Reported on 07/12/2024), Disp: 10 tablet, Rfl: 0 .   linaCLOtide (LINZESS) 290 mcg cap capsule, Take 290 mcg by mouth daily before meal. Take on an empty stomach, at least 30 minutes prior to a meal, and at approximately the same time each day (Patient not taking: Reported on 07/12/2024), Disp: , Rfl:  .  metroNIDAZOLE  (FLAGYL ) 500 mg tablet, Take 1 tablet (500 mg total) by mouth 2 (two) times a day for 7 days. (Patient not taking: Reported on 07/12/2024), Disp: 14 tablet, Rfl: 0

## 2024-08-07 ENCOUNTER — Emergency Department (HOSPITAL_BASED_OUTPATIENT_CLINIC_OR_DEPARTMENT_OTHER)
Admission: EM | Admit: 2024-08-07 | Discharge: 2024-08-07 | Disposition: A | Discharge status: Home / Self Care | Attending: Emergency Medicine | Admitting: Emergency Medicine

## 2024-08-07 ENCOUNTER — Encounter (HOSPITAL_BASED_OUTPATIENT_CLINIC_OR_DEPARTMENT_OTHER): Payer: Self-pay

## 2024-08-07 DIAGNOSIS — R300 Dysuria: Secondary | ICD-10-CM | POA: Diagnosis not present

## 2024-08-07 DIAGNOSIS — M545 Low back pain, unspecified: Secondary | ICD-10-CM | POA: Diagnosis not present

## 2024-08-07 DIAGNOSIS — R509 Fever, unspecified: Secondary | ICD-10-CM | POA: Diagnosis present

## 2024-08-07 DIAGNOSIS — U071 COVID-19: Secondary | ICD-10-CM | POA: Insufficient documentation

## 2024-08-07 HISTORY — DX: Constipation, unspecified: K59.00

## 2024-08-07 HISTORY — DX: Migraine, unspecified, not intractable, without status migrainosus: G43.909

## 2024-08-07 HISTORY — DX: Anogenital herpesviral infection, unspecified: A60.9

## 2024-08-07 LAB — CBC WITH DIFFERENTIAL/PLATELET
Abs Immature Granulocytes: 0.01 K/uL (ref 0.00–0.07)
Basophils Absolute: 0 K/uL (ref 0.0–0.1)
Basophils Relative: 1 %
Eosinophils Absolute: 0 K/uL (ref 0.0–0.5)
Eosinophils Relative: 1 %
HCT: 34.8 % — ABNORMAL LOW (ref 36.0–46.0)
Hemoglobin: 11.6 g/dL — ABNORMAL LOW (ref 12.0–15.0)
Immature Granulocytes: 0 %
Lymphocytes Relative: 8 %
Lymphs Abs: 0.3 K/uL — ABNORMAL LOW (ref 0.7–4.0)
MCH: 31.8 pg (ref 26.0–34.0)
MCHC: 33.3 g/dL (ref 30.0–36.0)
MCV: 95.3 fL (ref 80.0–100.0)
Monocytes Absolute: 0.7 K/uL (ref 0.1–1.0)
Monocytes Relative: 15 %
Neutro Abs: 3.3 K/uL (ref 1.7–7.7)
Neutrophils Relative %: 75 %
Platelets: 201 K/uL (ref 150–400)
RBC: 3.65 MIL/uL — ABNORMAL LOW (ref 3.87–5.11)
RDW: 11.9 % (ref 11.5–15.5)
WBC: 4.3 K/uL (ref 4.0–10.5)
nRBC: 0 % (ref 0.0–0.2)

## 2024-08-07 LAB — COMPREHENSIVE METABOLIC PANEL WITH GFR
ALT: 16 U/L (ref 0–44)
AST: 34 U/L (ref 15–41)
Albumin: 4.6 g/dL (ref 3.5–5.0)
Alkaline Phosphatase: 57 U/L (ref 38–126)
Anion gap: 13 (ref 5–15)
BUN: 10 mg/dL (ref 6–20)
CO2: 21 mmol/L — ABNORMAL LOW (ref 22–32)
Calcium: 9.3 mg/dL (ref 8.9–10.3)
Chloride: 103 mmol/L (ref 98–111)
Creatinine, Ser: 0.88 mg/dL (ref 0.44–1.00)
GFR, Estimated: >60 mL/min (ref >60)
Glucose, Bld: 101 mg/dL — ABNORMAL HIGH (ref 70–99)
Potassium: 3.6 mmol/L (ref 3.5–5.1)
Sodium: 137 mmol/L (ref 135–145)
Total Bilirubin: 1 mg/dL (ref 0.0–1.2)
Total Protein: 7.7 g/dL (ref 6.5–8.1)

## 2024-08-07 LAB — URINALYSIS, MICROSCOPIC (REFLEX)

## 2024-08-07 LAB — RESP PANEL BY RT-PCR (RSV, FLU A&B, COVID)  RVPGX2
Influenza A by PCR: NEGATIVE
Influenza B by PCR: NEGATIVE
Resp Syncytial Virus by PCR: NEGATIVE
SARS Coronavirus 2 by RT PCR: POSITIVE — AB

## 2024-08-07 LAB — URINALYSIS, ROUTINE W REFLEX MICROSCOPIC
Bilirubin Urine: NEGATIVE
Glucose, UA: NEGATIVE mg/dL
Ketones, ur: NEGATIVE mg/dL
Leukocytes,Ua: NEGATIVE
Nitrite: NEGATIVE
Protein, ur: NEGATIVE mg/dL
Specific Gravity, Urine: 1.02 (ref 1.005–1.030)
pH: 6.5 (ref 5.0–8.0)

## 2024-08-07 LAB — LIPASE, BLOOD: Lipase: 17 U/L (ref 11–51)

## 2024-08-07 LAB — PREGNANCY, URINE: Preg Test, Ur: NEGATIVE

## 2024-08-07 MED ORDER — KETOROLAC TROMETHAMINE 30 MG/ML IJ SOLN
30.0000 mg | Freq: Once | INTRAMUSCULAR | Status: AC
  Administered 2024-08-07: 30 mg via INTRAVENOUS
  Filled 2024-08-07: qty 1

## 2024-08-07 MED ORDER — SODIUM CHLORIDE 0.9 % IV BOLUS
1000.0000 mL | Freq: Once | INTRAVENOUS | Status: AC
  Administered 2024-08-07: 1000 mL via INTRAVENOUS

## 2024-08-07 NOTE — Discharge Instructions (Signed)
 Please pain and quarantine for the next week.  Your child should also stay home from school for the next week.  You may alternate Tylenol  and ibuprofen  as needed for fever and bodyaches.  Please focus on drinking plenty of fluids.  If you develop chest pain, significant trouble breathing, or confusion you should return to the emergency department.

## 2024-08-07 NOTE — ED Provider Notes (Signed)
 Emergency Department Provider Note   I have reviewed the triage vital signs and the nursing notes.   HISTORY  Chief Complaint Back Pain and Migraine   HPI Leah Knapp is a 28 y.o. female with past history reviewed below presents emergency department with fever, low back pain, dysuria, or cough/congestion and runny nose.  Symptoms been present for the past 3 to 4 days.  She notes her child is home with a cough and runny nose as well.  She has had dysuria for several weeks and reports going to see her OB/GYN and was tested for UTI but this was negative.  Back pain was not present at that time.  Last night her low back pain was moderate to severe with associated fever which prompted her to seek ED treatment this morning.  No chest pain or trouble breathing.  No blood in the stool.    Past Medical History:  Diagnosis Date   Constipation    HSV (herpes simplex virus) anogenital infection    Migraines     Review of Systems  Constitutional: Positive fever/chills Cardiovascular: Denies chest pain.  Respiratory: Denies shortness of breath. Positive cough.  Gastrointestinal: No abdominal pain. Positive nausea.  Musculoskeletal: Positive for back pain. Skin: Negative for rash. Neurological: Positive for headaches.  ____________________________________________   PHYSICAL EXAM:  VITAL SIGNS: ED Triage Vitals  Encounter Vitals Group     BP 08/07/24 0819 (!) 140/82     Pulse Rate 08/07/24 0819 (!) 110     Resp 08/07/24 0819 18     Temp 08/07/24 0819 (!) 101.9 F (38.8 C)     Temp src --      SpO2 08/07/24 0819 100 %     Weight 08/07/24 0822 188 lb (85.3 kg)   Constitutional: Alert and oriented. Well appearing and in no acute distress. Eyes: Conjunctivae are normal.  Head: Atraumatic. Nose: No congestion/rhinnorhea. Mouth/Throat: Mucous membranes are moist.   Neck: No stridor.   Cardiovascular: Tachycardia. Good peripheral circulation. Grossly normal heart sounds.    Respiratory: Normal respiratory effort.  No retractions. Lungs CTAB. Gastrointestinal: Soft and nontender. No distention. No CVA tenderness.  Musculoskeletal: No lower extremity tenderness nor edema. No gross deformities of extremities. Neurologic:  Normal speech and language. No gross focal neurologic deficits are appreciated.  Skin:  Skin is warm, dry and intact. No rash noted.  ____________________________________________   LABS (all labs ordered are listed, but only abnormal results are displayed)  Labs Reviewed  RESP PANEL BY RT-PCR (RSV, FLU A&B, COVID)  RVPGX2 - Abnormal; Notable for the following components:      Result Value   SARS Coronavirus 2 by RT PCR POSITIVE (*)    All other components within normal limits  URINALYSIS, ROUTINE W REFLEX MICROSCOPIC - Abnormal; Notable for the following components:   Hgb urine dipstick TRACE (*)    All other components within normal limits  COMPREHENSIVE METABOLIC PANEL WITH GFR - Abnormal; Notable for the following components:   CO2 21 (*)    Glucose, Bld 101 (*)    All other components within normal limits  CBC WITH DIFFERENTIAL/PLATELET - Abnormal; Notable for the following components:   RBC 3.65 (*)    Hemoglobin 11.6 (*)    HCT 34.8 (*)    Lymphs Abs 0.3 (*)    All other components within normal limits  URINALYSIS, MICROSCOPIC (REFLEX) - Abnormal; Notable for the following components:   Bacteria, UA RARE (*)    All other components  within normal limits  PREGNANCY, URINE  LIPASE, BLOOD   ____________________________________________  RADIOLOGY  No results found.  ____________________________________________   PROCEDURES  Procedure(s) performed:   Procedures  None  ____________________________________________   INITIAL IMPRESSION / ASSESSMENT AND PLAN / ED COURSE  Pertinent labs & imaging results that were available during my care of the patient were reviewed by me and considered in my medical decision making  (see chart for details).   This patient is Presenting for Evaluation of back pain, which does require a range of treatment options, and is a complaint that involves a high risk of morbidity and mortality.  The Differential Diagnoses include COVID, flu, RSV, UTI, pyelonephritis, sepsis, etc.  Critical Interventions-    Medications  sodium chloride  0.9 % bolus 1,000 mL (0 mLs Intravenous Stopped 08/07/24 1006)  ketorolac  (TORADOL ) 30 MG/ML injection 30 mg (30 mg Intravenous Given 08/07/24 0859)    Reassessment after intervention:  Symptoms improved.   Clinical Laboratory Tests Ordered, included UA without infection.  Pregnancy negative.  CBC with hemoglobin of 11.6.  No leukocytosis.  No acute kidney injury.  LFTs, bilirubin, lipase normal. COVID positive.   Cardiac Monitor Tracing which shows sinus tachycardia.    Social Determinants of Health Risk patient is a smoker.   Medical Decision Making: Summary:  Patient presents to the emergency department with fever, muscle aches, nasal congestion/cough.  Some dysuria but that has been with more ongoing/subacute process.  Pyelonephritis is a consideration but plan for broader screening including blood work, IV fluids, COVID PCR.   Reevaluation with update and discussion with patient.  Discussed COVID-positive result.  She has had 4 days of symptoms and is low risk for significant worsening.  We discussed antiviral medications but she would like to defer this for now.  Discussed supportive care, staying home from school, ED return precautions.  Patient stable for discharge.   Patient's presentation is most consistent with acute presentation with potential threat to life or bodily function.   Disposition: discharge  ____________________________________________  FINAL CLINICAL IMPRESSION(S) / ED DIAGNOSES  Final diagnoses:  COVID-19    Note:  This document was prepared using Dragon voice recognition software and may include unintentional  dictation errors.  Fonda Law, MD, [facility]    Shaolin Armas, Fonda MATSU, MD 08/07/24 [phone]

## 2024-08-07 NOTE — ED Notes (Signed)
 ED Provider at bedside.

## 2024-08-07 NOTE — ED Triage Notes (Addendum)
 Pt reports lower back pain since last night .Urinary frequency x 3 weeks and was seen by PCP and urine neg. Denies injury. Fever up to 104 last night. Migraines, coughing ,Runny nose sneezing and eyes hurting. Son has been sick
